# Patient Record
Sex: Female | Born: 1960
Health system: Southern US, Community
[De-identification: ages and names within clinical notes are randomized; demographics above are authoritative.]

## PROBLEM LIST (undated history)

## (undated) DIAGNOSIS — K219 Gastro-esophageal reflux disease without esophagitis: Secondary | ICD-10-CM

## (undated) DIAGNOSIS — R194 Change in bowel habit: Secondary | ICD-10-CM

## (undated) DIAGNOSIS — M545 Low back pain, unspecified: Secondary | ICD-10-CM

## (undated) DIAGNOSIS — F17200 Nicotine dependence, unspecified, uncomplicated: Secondary | ICD-10-CM

## (undated) DIAGNOSIS — E669 Obesity, unspecified: Secondary | ICD-10-CM

## (undated) DIAGNOSIS — I10 Essential (primary) hypertension: Secondary | ICD-10-CM

## (undated) DIAGNOSIS — G8929 Other chronic pain: Secondary | ICD-10-CM

## (undated) DIAGNOSIS — M549 Dorsalgia, unspecified: Secondary | ICD-10-CM

## (undated) DIAGNOSIS — K8020 Calculus of gallbladder without cholecystitis without obstruction: Secondary | ICD-10-CM

## (undated) DIAGNOSIS — M1990 Unspecified osteoarthritis, unspecified site: Secondary | ICD-10-CM

## (undated) DIAGNOSIS — E785 Hyperlipidemia, unspecified: Secondary | ICD-10-CM

## (undated) DIAGNOSIS — R7611 Nonspecific reaction to tuberculin skin test without active tuberculosis: Secondary | ICD-10-CM

## (undated) DIAGNOSIS — B1920 Unspecified viral hepatitis C without hepatic coma: Secondary | ICD-10-CM

## (undated) DIAGNOSIS — R7303 Prediabetes: Secondary | ICD-10-CM

## (undated) DIAGNOSIS — K810 Acute cholecystitis: Secondary | ICD-10-CM

## (undated) HISTORY — DX: Change in bowel habit: R19.4

## (undated) HISTORY — DX: Unspecified viral hepatitis C without hepatic coma: B19.20

## (undated) HISTORY — PX: OTHER SURGICAL HISTORY: SHX169

## (undated) HISTORY — DX: Essential (primary) hypertension: I10

## (undated) HISTORY — PX: COLONOSCOPY: SHX174

## (undated) HISTORY — DX: Nonspecific reaction to tuberculin skin test without active tuberculosis: R76.11

## (undated) HISTORY — DX: Hyperlipidemia, unspecified: E78.5

## (undated) HISTORY — DX: Gastro-esophageal reflux disease without esophagitis: K21.9

---
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
## 1986-10-15 HISTORY — PX: OTHER SURGICAL HISTORY: SHX169

## 1987-02-13 HISTORY — PX: TUBAL LIGATION: SHX77

## 1988-10-15 HISTORY — PX: APPENDECTOMY: SHX54

## 1998-03-09 ENCOUNTER — Encounter: Admission: RE | Admit: 1998-03-09 | Discharge: 1998-03-09 | Payer: Self-pay | Admitting: Family Medicine

## 1998-03-11 ENCOUNTER — Encounter: Admission: RE | Admit: 1998-03-11 | Discharge: 1998-03-11 | Payer: Self-pay | Admitting: Family Medicine

## 1998-09-05 ENCOUNTER — Ambulatory Visit (HOSPITAL_COMMUNITY): Admission: RE | Admit: 1998-09-05 | Discharge: 1998-09-05 | Payer: Self-pay | Admitting: *Deleted

## 1998-12-23 ENCOUNTER — Other Ambulatory Visit: Admission: RE | Admit: 1998-12-23 | Discharge: 1998-12-23 | Payer: Self-pay | Admitting: *Deleted

## 1999-11-16 ENCOUNTER — Encounter: Admission: RE | Admit: 1999-11-16 | Discharge: 1999-11-16 | Payer: Self-pay | Admitting: Internal Medicine

## 1999-11-16 ENCOUNTER — Encounter: Payer: Self-pay | Admitting: Internal Medicine

## 2000-06-18 ENCOUNTER — Other Ambulatory Visit: Admission: RE | Admit: 2000-06-18 | Discharge: 2000-06-18 | Payer: Self-pay | Admitting: Internal Medicine

## 2000-07-10 ENCOUNTER — Encounter: Admission: RE | Admit: 2000-07-10 | Discharge: 2000-07-10 | Payer: Self-pay | Admitting: Internal Medicine

## 2000-07-10 ENCOUNTER — Encounter: Payer: Self-pay | Admitting: Internal Medicine

## 2000-07-22 ENCOUNTER — Encounter: Admission: RE | Admit: 2000-07-22 | Discharge: 2000-10-20 | Payer: Self-pay | Admitting: Internal Medicine

## 2000-12-09 ENCOUNTER — Encounter: Admission: RE | Admit: 2000-12-09 | Discharge: 2001-03-09 | Payer: Self-pay | Admitting: Internal Medicine

## 2001-09-01 ENCOUNTER — Other Ambulatory Visit: Admission: RE | Admit: 2001-09-01 | Discharge: 2001-09-01 | Payer: Self-pay | Admitting: Internal Medicine

## 2001-09-08 ENCOUNTER — Encounter: Payer: Self-pay | Admitting: Internal Medicine

## 2001-09-08 ENCOUNTER — Encounter: Admission: RE | Admit: 2001-09-08 | Discharge: 2001-09-08 | Payer: Self-pay | Admitting: Internal Medicine

## 2001-09-10 ENCOUNTER — Encounter: Admission: RE | Admit: 2001-09-10 | Discharge: 2001-09-10 | Payer: Self-pay | Admitting: Internal Medicine

## 2001-09-10 ENCOUNTER — Encounter: Payer: Self-pay | Admitting: Internal Medicine

## 2001-09-16 ENCOUNTER — Ambulatory Visit (HOSPITAL_COMMUNITY): Admission: RE | Admit: 2001-09-16 | Discharge: 2001-09-16 | Payer: Self-pay | Admitting: Internal Medicine

## 2001-09-16 ENCOUNTER — Encounter: Payer: Self-pay | Admitting: Internal Medicine

## 2001-11-28 ENCOUNTER — Encounter: Admission: RE | Admit: 2001-11-28 | Discharge: 2002-02-26 | Payer: Self-pay | Admitting: Internal Medicine

## 2001-12-16 ENCOUNTER — Ambulatory Visit (HOSPITAL_COMMUNITY): Admission: RE | Admit: 2001-12-16 | Discharge: 2001-12-16 | Payer: Self-pay | Admitting: Gastroenterology

## 2001-12-16 ENCOUNTER — Encounter: Payer: Self-pay | Admitting: Gastroenterology

## 2001-12-16 ENCOUNTER — Encounter (INDEPENDENT_AMBULATORY_CARE_PROVIDER_SITE_OTHER): Payer: Self-pay | Admitting: Specialist

## 2003-03-05 ENCOUNTER — Encounter: Payer: Self-pay | Admitting: Internal Medicine

## 2003-03-05 ENCOUNTER — Encounter: Admission: RE | Admit: 2003-03-05 | Discharge: 2003-03-05 | Payer: Self-pay | Admitting: Internal Medicine

## 2004-08-14 ENCOUNTER — Encounter: Admission: RE | Admit: 2004-08-14 | Discharge: 2004-08-14 | Payer: Self-pay | Admitting: Internal Medicine

## 2004-12-08 ENCOUNTER — Emergency Department (HOSPITAL_COMMUNITY): Admission: EM | Admit: 2004-12-08 | Discharge: 2004-12-08 | Payer: Self-pay | Admitting: Family Medicine

## 2006-07-02 ENCOUNTER — Encounter: Admission: RE | Admit: 2006-07-02 | Discharge: 2006-07-02 | Payer: Self-pay | Admitting: Internal Medicine

## 2008-03-11 DIAGNOSIS — R109 Unspecified abdominal pain: Secondary | ICD-10-CM | POA: Insufficient documentation

## 2008-03-11 DIAGNOSIS — R110 Nausea: Secondary | ICD-10-CM

## 2008-03-11 DIAGNOSIS — R197 Diarrhea, unspecified: Secondary | ICD-10-CM

## 2008-03-11 DIAGNOSIS — E739 Lactose intolerance, unspecified: Secondary | ICD-10-CM

## 2008-03-11 DIAGNOSIS — R634 Abnormal weight loss: Secondary | ICD-10-CM

## 2008-03-11 DIAGNOSIS — I10 Essential (primary) hypertension: Secondary | ICD-10-CM | POA: Insufficient documentation

## 2008-03-11 DIAGNOSIS — B1710 Acute hepatitis C without hepatic coma: Secondary | ICD-10-CM

## 2008-03-12 ENCOUNTER — Ambulatory Visit: Payer: Self-pay | Admitting: Internal Medicine

## 2008-05-03 ENCOUNTER — Ambulatory Visit: Payer: Self-pay | Admitting: Internal Medicine

## 2008-05-03 ENCOUNTER — Encounter: Payer: Self-pay | Admitting: Internal Medicine

## 2008-05-04 ENCOUNTER — Encounter: Payer: Self-pay | Admitting: Internal Medicine

## 2008-09-12 ENCOUNTER — Ambulatory Visit (HOSPITAL_COMMUNITY): Admission: RE | Admit: 2008-09-12 | Discharge: 2008-09-12 | Payer: Self-pay | Admitting: Internal Medicine

## 2009-04-08 ENCOUNTER — Encounter: Admission: RE | Admit: 2009-04-08 | Discharge: 2009-04-08 | Payer: Self-pay | Admitting: Internal Medicine

## 2010-03-30 ENCOUNTER — Other Ambulatory Visit: Admission: RE | Admit: 2010-03-30 | Discharge: 2010-03-30 | Payer: Self-pay | Admitting: Family Medicine

## 2010-11-05 ENCOUNTER — Encounter: Payer: Self-pay | Admitting: Internal Medicine

## 2011-04-19 ENCOUNTER — Inpatient Hospital Stay (INDEPENDENT_AMBULATORY_CARE_PROVIDER_SITE_OTHER)
Admission: RE | Admit: 2011-04-19 | Discharge: 2011-04-19 | Disposition: A | Discharge status: Home / Self Care | Payer: 59 | Source: Ambulatory Visit | Attending: Family Medicine | Admitting: Family Medicine

## 2011-04-19 DIAGNOSIS — J029 Acute pharyngitis, unspecified: Secondary | ICD-10-CM

## 2011-11-13 ENCOUNTER — Other Ambulatory Visit: Payer: Self-pay | Admitting: Family Medicine

## 2011-11-13 DIAGNOSIS — Z1231 Encounter for screening mammogram for malignant neoplasm of breast: Secondary | ICD-10-CM

## 2011-11-15 ENCOUNTER — Ambulatory Visit
Admission: RE | Admit: 2011-11-15 | Discharge: 2011-11-15 | Disposition: A | Discharge status: Home / Self Care | Payer: 59 | Source: Ambulatory Visit | Attending: Family Medicine | Admitting: Family Medicine

## 2011-11-15 DIAGNOSIS — Z1231 Encounter for screening mammogram for malignant neoplasm of breast: Secondary | ICD-10-CM

## 2012-08-20 ENCOUNTER — Other Ambulatory Visit: Payer: Self-pay | Admitting: Internal Medicine

## 2012-08-20 MED ORDER — TRIAMTERENE-HCTZ 37.5-25 MG PO CAPS: 1.0000 | ORAL_CAPSULE | ORAL | Status: DC

## 2012-08-20 NOTE — Progress Notes (Signed)
Has appt with me to est care. Out of triamterene / HCTZ 2 weeks. Reviewed last ER note and that was her med. Will refill.

## 2012-09-16 ENCOUNTER — Encounter: Payer: 59 | Admitting: Internal Medicine

## 2012-09-17 ENCOUNTER — Other Ambulatory Visit (INDEPENDENT_AMBULATORY_CARE_PROVIDER_SITE_OTHER): Payer: 59

## 2012-09-17 ENCOUNTER — Encounter: Payer: Self-pay | Admitting: Internal Medicine

## 2012-09-17 ENCOUNTER — Ambulatory Visit (INDEPENDENT_AMBULATORY_CARE_PROVIDER_SITE_OTHER): Payer: 59 | Admitting: Internal Medicine

## 2012-09-17 VITALS — BP 126/74 | HR 65 | Temp 98.1°F | Ht 65.5 in | Wt 234.0 lb

## 2012-09-17 DIAGNOSIS — F411 Generalized anxiety disorder: Secondary | ICD-10-CM

## 2012-09-17 DIAGNOSIS — Z1329 Encounter for screening for other suspected endocrine disorder: Secondary | ICD-10-CM

## 2012-09-17 DIAGNOSIS — Z131 Encounter for screening for diabetes mellitus: Secondary | ICD-10-CM

## 2012-09-17 DIAGNOSIS — Z13220 Encounter for screening for lipoid disorders: Secondary | ICD-10-CM

## 2012-09-17 DIAGNOSIS — Z0000 Encounter for general adult medical examination without abnormal findings: Secondary | ICD-10-CM

## 2012-09-17 DIAGNOSIS — Z1321 Encounter for screening for nutritional disorder: Secondary | ICD-10-CM

## 2012-09-17 DIAGNOSIS — M8580 Other specified disorders of bone density and structure, unspecified site: Secondary | ICD-10-CM

## 2012-09-17 DIAGNOSIS — Z130 Encounter for screening for diseases of the blood and blood-forming organs and certain disorders involving the immune mechanism: Secondary | ICD-10-CM

## 2012-09-17 DIAGNOSIS — I10 Essential (primary) hypertension: Secondary | ICD-10-CM

## 2012-09-17 DIAGNOSIS — M949 Disorder of cartilage, unspecified: Secondary | ICD-10-CM

## 2012-09-17 DIAGNOSIS — E785 Hyperlipidemia, unspecified: Secondary | ICD-10-CM

## 2012-09-17 DIAGNOSIS — K219 Gastro-esophageal reflux disease without esophagitis: Secondary | ICD-10-CM

## 2012-09-17 DIAGNOSIS — F419 Anxiety disorder, unspecified: Secondary | ICD-10-CM

## 2012-09-17 DIAGNOSIS — B86 Scabies: Secondary | ICD-10-CM

## 2012-09-17 LAB — CBC
HCT: 40.7 % (ref 36.0–46.0)
Hemoglobin: 13.4 g/dL (ref 12.0–15.0)
MCV: 87.6 fl (ref 78.0–100.0)
RBC: 4.64 Mil/uL (ref 3.87–5.11)
RDW: 16 % — ABNORMAL HIGH (ref 11.5–14.6)

## 2012-09-17 LAB — BASIC METABOLIC PANEL
BUN: 11 mg/dL (ref 6–23)
CO2: 32 mEq/L (ref 19–32)
Chloride: 101 mEq/L (ref 96–112)
Potassium: 3.6 mEq/L (ref 3.5–5.1)
Sodium: 139 mEq/L (ref 135–145)

## 2012-09-17 LAB — HEMOGLOBIN A1C: Hgb A1c MFr Bld: 6.1 % (ref 4.6–6.5)

## 2012-09-17 LAB — HEPATIC FUNCTION PANEL
ALT: 25 U/L (ref 0–35)
AST: 29 U/L (ref 0–37)
Albumin: 3.8 g/dL (ref 3.5–5.2)
Bilirubin, Direct: 0.1 mg/dL (ref 0.0–0.3)
Total Protein: 7.3 g/dL (ref 6.0–8.3)

## 2012-09-17 LAB — TSH: TSH: 1.03 u[IU]/mL (ref 0.35–5.50)

## 2012-09-17 LAB — LIPID PANEL: Total CHOL/HDL Ratio: 4

## 2012-09-17 MED ORDER — SIMVASTATIN 40 MG PO TABS: 40.0000 mg | ORAL_TABLET | Freq: Every day | ORAL | Status: DC

## 2012-09-17 MED ORDER — ALPRAZOLAM 0.5 MG PO TABS: 0.5000 mg | ORAL_TABLET | Freq: Every evening | ORAL | Status: DC | PRN

## 2012-09-17 MED ORDER — BUPROPION HCL ER (SR) 150 MG PO TB12: 150.0000 mg | ORAL_TABLET | Freq: Every day | ORAL | Status: DC

## 2012-09-17 MED ORDER — TRIAMTERENE-HCTZ 37.5-25 MG PO CAPS: 1.0000 | ORAL_CAPSULE | ORAL | Status: DC

## 2012-09-17 MED ORDER — PERMETHRIN 1 % EX LOTN: TOPICAL_LOTION | Freq: Once | CUTANEOUS | Status: DC

## 2012-09-17 MED ORDER — PANTOPRAZOLE SODIUM 40 MG PO TBEC: 40.0000 mg | DELAYED_RELEASE_TABLET | Freq: Two times a day (BID) | ORAL | Status: DC

## 2012-09-17 NOTE — Patient Instructions (Signed)

## 2012-09-17 NOTE — Progress Notes (Signed)
HPI  Pt presents to the clinic today to establish care. Her only concern is that she has a rash on her hands and feet and all over her abdomen. It is very itchy. She has been to dermatology who has prescribed her some cream for this rash but it is not helping.  Flu vaccine: 2013 Tdap: within the last 10 years Colon Screening: within the last 10 years Pap 2012 LMP 08/2012 Mammogram 2013 Bone density never  Past Medical History  Diagnosis Date  . GERD (gastroesophageal reflux disease)   . Hepatitis C   . Hypertension   . Hyperlipidemia   . PPD positive     Current Outpatient Prescriptions  Medication Sig Dispense Refill  . buPROPion (WELLBUTRIN SR) 150 MG 12 hr tablet Take 150 mg by mouth daily.      . Calcitriol (VECTICAL) 3 MCG/GM cream Apply topically at bedtime.      . clobetasol cream (TEMOVATE) 0.05 % Apply topically 2 (two) times daily.      . cyclobenzaprine (FLEXERIL) 10 MG tablet Take 10 mg by mouth as needed.      . gabapentin (NEURONTIN) 100 MG capsule Take 100 mg by mouth 2 (two) times daily.      . meloxicam (MOBIC) 15 MG tablet Take 15 mg by mouth daily.      . pantoprazole (PROTONIX) 40 MG tablet Take 40 mg by mouth 2 (two) times daily.      . simvastatin (ZOCOR) 40 MG tablet Take 40 mg by mouth daily.      . traMADol (ULTRAM) 50 MG tablet Take 50 mg by mouth every 6 (six) hours as needed.      . triamterene-hydrochlorothiazide (DYAZIDE) 37.5-25 MG per capsule Take 1 each (1 capsule total) by mouth every morning.  30 capsule  1    No Known Allergies  Family History  Problem Relation Age of Onset  . Cancer Mother     Colon Cancer  . Hyperlipidemia Mother   . Hypertension Mother   . Hypertension Father   . Diabetes Father   . Diabetes Sister   . Diabetes Maternal Grandfather     History   Social History  . Marital Status: Divorced    Spouse Name: N/A    Number of Children: 3  . Years of Education: 12+   Occupational History  . Communication  Specialist [employer]   Social History Main Topics  . Smoking status: Current Every Day Smoker -- 0.5 packs/day  . Smokeless tobacco: Not on file  . Alcohol Use: 1.7 oz/week    1 Glasses of wine, 1 Cans of beer, 1 Drinks containing 0.5 oz of alcohol per week  . Drug Use: No  . Sexually Active: Not on file   Other Topics Concern  . Not on file   Social History Narrative   Regular exercise-noCaffeine Use-yes    ROS:  Constitutional: Denies fever, malaise, fatigue, headache or abrupt weight changes.  HEENT: Denies eye pain, eye redness, ear pain, ringing in the ears, wax buildup, runny nose, nasal congestion, bloody nose, or sore throat. Respiratory: Denies difficulty breathing, shortness of breath, cough or sputum production.   Cardiovascular: Denies chest pain, chest tightness, palpitations or swelling in the hands or feet.  Gastrointestinal: Denies abdominal pain, bloating, constipation, diarrhea or blood in the stool.  GU: Denies frequency, urgency, pain with urination, blood in urine, odor or discharge. Musculoskeletal: Denies decrease in range of motion, difficulty with gait, muscle pain or joint pain  and swelling.  Skin: Pt reports rash on hands, feet and abdomen. Denies redness, lesions or ulcercations.  Neurological: Denies dizziness, difficulty with memory, difficulty with speech or problems with balance and coordination.   No other specific complaints in a complete review of systems (except as listed in HPI above).  PE:  BP 126/74  Pulse 65  Temp 98.1 F (36.7 C) (Oral)  Ht 5' 5.5" (1.664 m)  Wt 234 lb (106.142 kg)  BMI 38.35 kg/m2  SpO2 95%  LMP 08/15/2012 Wt Readings from Last 3 Encounters:  09/17/12 234 lb (106.142 kg)  03/12/08 255 lb 8 oz (115.894 kg)    General: Appears her stated age, obese but well developed, well nourished in NAD. Skin: Papular rash noted on bilateral hands in between fingers with burrow tracks. Scabbed over from the patient  scratching. Similar rash noted on lower legs and abdomen. HEENT: Head: normal shape and size; Eyes: sclera white, no icterus, conjunctiva pink, PERRLA and EOMs intact; Ears: Tm's gray and intact, normal light reflex; Nose: mucosa pink and moist, septum midline; Throat/Mouth: Teeth present, mucosa pink and moist, no lesions or ulcerations noted.  Neck: Normal range of motion. Neck supple, trachea midline. No massses, lumps or thyromegaly present.  Cardiovascular: Normal rate and rhythm. S1,S2 noted.  No murmur, rubs or gallops noted. No JVD or BLE edema. No carotid bruits noted. Pulmonary/Chest: Normal effort and positive vesicular breath sounds. No respiratory distress. No wheezes, rales or ronchi noted.  Abdomen: Soft and nontender. Normal bowel sounds, no bruits noted. No distention or masses noted. Liver, spleen and kidneys non palpable. Musculoskeletal: Normal range of motion. No signs of joint swelling. No difficulty with gait.  Neurological: Alert and oriented. Cranial nerves II-XII intact. Coordination normal. +DTRs bilaterally. Psychiatric: Mood and affect normal. Behavior is normal. Judgment and thought content normal.    Assessment and Plan:  Preventative Health Maintenance:  Will obtain basic labs: CBC, BMET, Vit D, lipids, LFT, TSH and Hgb A1C Not due for colonoscopy, mammogram or pap Will obtain bone density screening today Meds refilled  Rash, likely die to scabies, new onset with additional workup required:  Permetherin cream apply topically once Wash all belongings in hot water  RTC in 1 year or before if needed.

## 2012-09-18 ENCOUNTER — Other Ambulatory Visit: Payer: Self-pay | Admitting: Internal Medicine

## 2012-09-18 DIAGNOSIS — E559 Vitamin D deficiency, unspecified: Secondary | ICD-10-CM

## 2012-09-18 LAB — VITAMIN D 25 HYDROXY (VIT D DEFICIENCY, FRACTURES): Vit D, 25-Hydroxy: 19 ng/mL — ABNORMAL LOW (ref 30–89)

## 2012-09-18 MED ORDER — ERGOCALCIFEROL 1.25 MG (50000 UT) PO CAPS: 50000.0000 [IU] | ORAL_CAPSULE | ORAL | Status: DC

## 2012-09-25 ENCOUNTER — Other Ambulatory Visit: Payer: Self-pay | Admitting: Internal Medicine

## 2012-09-25 DIAGNOSIS — B86 Scabies: Secondary | ICD-10-CM

## 2012-09-25 MED ORDER — PERMETHRIN 1 % EX LOTN: TOPICAL_LOTION | Freq: Once | CUTANEOUS | Status: DC

## 2012-09-26 ENCOUNTER — Other Ambulatory Visit (INDEPENDENT_AMBULATORY_CARE_PROVIDER_SITE_OTHER): Payer: Self-pay

## 2012-09-30 ENCOUNTER — Encounter: Payer: 59 | Admitting: Internal Medicine

## 2012-09-30 ENCOUNTER — Ambulatory Visit: Payer: 59 | Admitting: Internal Medicine

## 2012-10-10 ENCOUNTER — Other Ambulatory Visit: Payer: Self-pay | Admitting: Internal Medicine

## 2012-10-29 ENCOUNTER — Other Ambulatory Visit: Payer: Self-pay | Admitting: Internal Medicine

## 2012-11-03 ENCOUNTER — Other Ambulatory Visit: Payer: Self-pay | Admitting: *Deleted

## 2012-11-03 DIAGNOSIS — F419 Anxiety disorder, unspecified: Secondary | ICD-10-CM

## 2012-11-03 MED ORDER — ALPRAZOLAM 0.5 MG PO TABS: 0.5000 mg | ORAL_TABLET | Freq: Every evening | ORAL | Status: DC | PRN

## 2012-11-03 NOTE — Telephone Encounter (Signed)
Rx faxed to [facility].  

## 2012-11-03 NOTE — Telephone Encounter (Signed)
Ok to refill 

## 2012-11-03 NOTE — Telephone Encounter (Signed)
R'cd fax from [facility] for refill of Alprazolam-last written 09/17/2012 #30 with 0 refills. Please advise.

## 2012-12-22 ENCOUNTER — Other Ambulatory Visit: Payer: Self-pay | Admitting: Internal Medicine

## 2012-12-29 ENCOUNTER — Other Ambulatory Visit: Payer: Self-pay | Admitting: Internal Medicine

## 2013-02-09 ENCOUNTER — Other Ambulatory Visit: Payer: Self-pay

## 2013-02-09 DIAGNOSIS — Z1231 Encounter for screening mammogram for malignant neoplasm of breast: Secondary | ICD-10-CM

## 2013-02-12 ENCOUNTER — Telehealth: Payer: Self-pay | Admitting: Internal Medicine

## 2013-02-12 ENCOUNTER — Other Ambulatory Visit: Payer: Self-pay | Admitting: Internal Medicine

## 2013-02-12 DIAGNOSIS — Z1211 Encounter for screening for malignant neoplasm of colon: Secondary | ICD-10-CM

## 2013-02-12 NOTE — Telephone Encounter (Signed)
Referral placed, they will call her when the appointment is set up

## 2013-02-12 NOTE — Telephone Encounter (Signed)
Per [employer] message patient is requesting a referral be placed for her to have a colonoscopy with [facility]

## 2013-02-16 ENCOUNTER — Encounter: Payer: Self-pay | Admitting: Internal Medicine

## 2013-02-25 ENCOUNTER — Ambulatory Visit: Payer: 59

## 2013-04-14 ENCOUNTER — Ambulatory Visit (AMBULATORY_SURGERY_CENTER): Payer: 59

## 2013-04-14 VITALS — Ht 65.5 in | Wt 243.2 lb

## 2013-04-14 DIAGNOSIS — Z800 Family history of malignant neoplasm of digestive organs: Secondary | ICD-10-CM

## 2013-04-14 DIAGNOSIS — Z86010 Personal history of colonic polyps: Secondary | ICD-10-CM

## 2013-04-14 MED ORDER — MOVIPREP 100 G PO SOLR: ORAL | Status: DC

## 2013-04-24 ENCOUNTER — Encounter: Payer: 59 | Admitting: Internal Medicine

## 2013-04-24 ENCOUNTER — Encounter: Payer: Self-pay | Admitting: Internal Medicine

## 2013-04-24 ENCOUNTER — Ambulatory Visit (AMBULATORY_SURGERY_CENTER): Payer: 59 | Admitting: Internal Medicine

## 2013-04-24 VITALS — BP 119/57 | HR 60 | Temp 98.1°F | Resp 15 | Ht 65.5 in | Wt 243.0 lb

## 2013-04-24 DIAGNOSIS — D126 Benign neoplasm of colon, unspecified: Secondary | ICD-10-CM

## 2013-04-24 DIAGNOSIS — Z86010 Personal history of colonic polyps: Secondary | ICD-10-CM

## 2013-04-24 MED ORDER — SODIUM CHLORIDE 0.9 % IV SOLN: 500.0000 mL | INTRAVENOUS | Status: DC

## 2013-04-24 NOTE — Op Note (Signed)
[facility] [address] [place], [address]   COLONOSCOPY PROCEDURE REPORT  PATIENT: Jody [name], Jody [name]  MR#: [id number] BIRTHDATE: 1960/10/28 , 51  yrs. old GENDER: Female ENDOSCOPIST: Hart Carwin, MD REFERRED BY:  Nicki Reaper.NP PROCEDURE DATE:  04/24/2013 PROCEDURE:   Colonoscopy with cold biopsy polypectomy ASA CLASS:   Class II INDICATIONS:Patient's immediate family history of colon cancer, Patient's personal history of colon polyps, and prior colonoscopy in 2004 and 2009, parent with colon cancer at 12. MEDICATIONS: MAC sedation, administered by CRNA and propofol (Diprivan) 300mg  IV  DESCRIPTION OF PROCEDURE:   After the risks and benefits and of the procedure were explained, informed consent was obtained.  A digital rectal exam revealed no abnormalities of the rectum.    The LB [id number]  endoscope was introduced through the anus and advanced to the cecum, which was identified by both the appendix and ileocecal valve .  The quality of the prep was excellent, using MoviPrep .  The instrument was then slowly withdrawn as the colon was fully examined.     COLON FINDINGS: Two smooth sessile polyps ranging between 3-76mm in size were found in the descending colon.  A polypectomy was performed with cold forceps.  The resection was complete and the polyp tissue was completely retrieved.     Retroflexed views revealed no abnormalities.     The scope was then withdrawn from the patient and the procedure completed.  COMPLICATIONS: There were no complications. ENDOSCOPIC IMPRESSION: Two sessile polyps ranging between 3-27mm in size were found in the descending colon; polypectomy was performed with cold forceps  RECOMMENDATIONS: 1.  Await pathology results 2.  High fiber diet   REPEAT EXAM: In 5 year(s)  for Colonoscopy.  cc:  _______________________________ eSignedHart Carwin, MD 04/24/2013 10:40 AM     PATIENT NAME:  Jody [name], Jody [name] MR#: [id number]

## 2013-04-24 NOTE — Progress Notes (Signed)
Patient did not experience any of the following events: a burn prior to discharge; a fall within the facility; wrong site/side/patient/procedure/implant event; or a hospital transfer or hospital admission upon discharge from the facility. (G8907) Patient did not have preoperative order for IV antibiotic SSI prophylaxis. (G8918)  

## 2013-04-24 NOTE — Patient Instructions (Addendum)

## 2013-04-24 NOTE — Progress Notes (Signed)
Called to room to assist during endoscopic procedure.  Patient ID and intended procedure confirmed with present staff. Received instructions for my participation in the procedure from the performing physician.  

## 2013-04-24 NOTE — Progress Notes (Signed)
Stable to RR 

## 2013-04-27 ENCOUNTER — Telehealth: Payer: Self-pay | Admitting: *Deleted

## 2013-04-27 NOTE — Telephone Encounter (Signed)
  Follow up Call-  Call back number 04/24/2013  Post procedure Call Back phone  # [phone]  Permission to leave phone message Yes     No answer at # given.  Message left on voicemail.

## 2013-04-29 ENCOUNTER — Encounter: Payer: Self-pay | Admitting: Internal Medicine

## 2013-05-07 ENCOUNTER — Ambulatory Visit (INDEPENDENT_AMBULATORY_CARE_PROVIDER_SITE_OTHER): Payer: 59 | Admitting: Internal Medicine

## 2013-05-07 ENCOUNTER — Encounter: Payer: Self-pay | Admitting: Internal Medicine

## 2013-05-07 ENCOUNTER — Other Ambulatory Visit (INDEPENDENT_AMBULATORY_CARE_PROVIDER_SITE_OTHER): Payer: 59

## 2013-05-07 VITALS — BP 142/88 | HR 81 | Temp 98.5°F | Wt 236.0 lb

## 2013-05-07 DIAGNOSIS — R1013 Epigastric pain: Secondary | ICD-10-CM

## 2013-05-07 DIAGNOSIS — R112 Nausea with vomiting, unspecified: Secondary | ICD-10-CM

## 2013-05-07 LAB — LIPASE: Lipase: 14 U/L (ref 11.0–59.0)

## 2013-05-07 LAB — CBC
HCT: 38.2 % (ref 36.0–46.0)
Hemoglobin: 12.8 g/dL (ref 12.0–15.0)
MCV: 89.2 fl (ref 78.0–100.0)
RBC: 4.28 Mil/uL (ref 3.87–5.11)
WBC: 9.2 10*3/uL (ref 4.5–10.5)

## 2013-05-07 LAB — COMPREHENSIVE METABOLIC PANEL
Alkaline Phosphatase: 50 U/L (ref 39–117)
CO2: 30 mEq/L (ref 19–32)
Creatinine, Ser: 0.8 mg/dL (ref 0.4–1.2)
GFR: 95.93 mL/min (ref >60.00)
Glucose, Bld: 122 mg/dL — ABNORMAL HIGH (ref 70–99)
Total Bilirubin: 1.5 mg/dL — ABNORMAL HIGH (ref 0.3–1.2)

## 2013-05-07 LAB — AMYLASE: Amylase: 55 U/L (ref 27–131)

## 2013-05-07 MED ORDER — ONDANSETRON HCL 4 MG PO TABS: 4.0000 mg | ORAL_TABLET | Freq: Three times a day (TID) | ORAL | Status: DC | PRN

## 2013-05-07 NOTE — Patient Instructions (Signed)

## 2013-05-07 NOTE — Progress Notes (Signed)
Subjective:    Patient ID: Jody [name], female    DOB: 1961/10/09, 52 y.o.   MRN: [id number]  HPI  Pt presents to the clinic today with c/o nausea, vomiting, chills and heartburn since Sunday. It does seem to be worse after she eats. She does not have a fever that she is aware of. She denies blood in her stool. She has not been able to eat anything really in 4 days. She is having normal BM's. She did eat tuna on Sunday but she has had this multiple times before. She has increased her protonix but this does not help. She does feel like she is getting any better. Sometimes laying down makes her feel better. She has never experienced anything like this before.   Review of Systems      Past Medical History  Diagnosis Date  . GERD (gastroesophageal reflux disease)   . Hepatitis C   . Hypertension   . Hyperlipidemia   . PPD positive   . Bowel habit changes     Current Outpatient Prescriptions  Medication Sig Dispense Refill  . ALPRAZolam (XANAX) 0.5 MG tablet TAKE 1 TABLET BY MOUTH AT BEDTIME AS NEEDED FOR SLEEP  30 tablet  0  . buPROPion (WELLBUTRIN SR) 150 MG 12 hr tablet Take 1 tablet (150 mg total) by mouth daily.  90 tablet  3  . Calcitriol (VECTICAL) 3 MCG/GM cream Apply topically as needed.       . clobetasol cream (TEMOVATE) 0.05 % Apply topically as needed.       . cyclobenzaprine (FLEXERIL) 10 MG tablet Take 10 mg by mouth as needed.      . meloxicam (MOBIC) 15 MG tablet Take 15 mg by mouth daily.      . pantoprazole (PROTONIX) 40 MG tablet Take 1 tablet (40 mg total) by mouth 2 (two) times daily.  180 tablet  3  . permethrin (ELIMITE) 1 % lotion SHAMPOO, RINSE AND TOWEL DRY, SATURATE HAIR AND SCALP WITH PERMETHRIN, RINSE AFTER 10 MINUTES, REPEAT IN 1 WEEK IF NEEDED  120 mL  PRN  . simvastatin (ZOCOR) 40 MG tablet Take 1 tablet (40 mg total) by mouth daily.  90 tablet  3  . traMADol (ULTRAM) 50 MG tablet Take 50 mg by mouth every 6 (six) hours as needed.      .  triamterene-hydrochlorothiazide (DYAZIDE) 37.5-25 MG per capsule Take 1 each (1 capsule total) by mouth every morning.  90 capsule  3  . ergocalciferol (VITAMIN D2) 50000 UNITS capsule Take 1 capsule (50,000 Units total) by mouth once a week.  4 capsule  12  . gabapentin (NEURONTIN) 100 MG capsule Take 100 mg by mouth 2 (two) times daily.       No current facility-administered medications for this visit.    No Known Allergies  Family History  Problem Relation Age of Onset  . Cancer Mother     Colon Cancer  . Hyperlipidemia Mother   . Hypertension Mother   . Colon cancer Mother   . Hypertension Father   . Diabetes Father   . Diabetes Sister   . Diabetes Maternal Grandfather     History   Social History  . Marital Status: Divorced    Spouse Name: N/A    Number of Children: 3  . Years of Education: 12+   Occupational History  . Communication Specialist [employer]   Social History Main Topics  . Smoking status: Current Every Day Smoker  Types: Cigarettes  . Smokeless tobacco: Never Used     Comment: Smokes 4 cigarettes a day.  . Alcohol Use: 2.2 oz/week    1 Glasses of wine, 1 Cans of beer, 2 Drinks containing 0.5 oz of alcohol per week  . Drug Use: No  . Sexually Active: Not on file   Other Topics Concern  . Not on file   Social History Narrative   Regular exercise-no   Caffeine Use-yes     Constitutional: Pt reports malaise. Denies fever, fatigue, headache or abrupt weight changes.  Respiratory: Denies difficulty breathing, shortness of breath, cough or sputum production.   Cardiovascular: Denies chest pain, chest tightness, palpitations or swelling in the hands or feet.  Gastrointestinal: Pt reports nausea, vomiting and reflux. Denies abdominal pain, bloating, constipation, diarrhea or blood in the stool.  GU: Denies urgency, frequency, pain with urination, burning sensation, blood in urine, odor or discharge.  No other specific complaints in a complete  review of systems (except as listed in HPI above).   Objective:   Physical Exam  BP 142/88  Pulse 81  Temp(Src) 98.5 F (36.9 C) (Oral)  Wt 236 lb (107.049 kg)  BMI 38.66 kg/m2  SpO2 98%  LMP 03/29/2013 Wt Readings from Last 3 Encounters:  05/07/13 236 lb (107.049 kg)  04/24/13 243 lb (110.224 kg)  04/14/13 243 lb 3.2 oz (110.315 kg)    General: Appears her stated age, obese but well developed, well nourished in NAD.  Cardiovascular: Normal rate and rhythm. S1,S2 noted.  No murmur, rubs or gallops noted. No JVD or BLE edema. No carotid bruits noted. Pulmonary/Chest: Normal effort and positive vesicular breath sounds. No respiratory distress. No wheezes, rales or ronchi noted.  Abdomen: Soft and tender in the epigastric area. Normal bowel sounds, no bruits noted. No distention or masses noted. Liver, spleen and kidneys non palpable.   BMET    Component Value Date/Time   NA 139 09/17/2012 1406   K 3.6 09/17/2012 1406   CL 101 09/17/2012 1406   CO2 32 09/17/2012 1406   GLUCOSE 109* 09/17/2012 1406   BUN 11 09/17/2012 1406   CREATININE 0.7 09/17/2012 1406   CALCIUM 9.0 09/17/2012 1406    Lipid Panel     Component Value Date/Time   CHOL 229* 09/17/2012 1406   TRIG 129.0 09/17/2012 1406   HDL 53.40 09/17/2012 1406   CHOLHDL 4 09/17/2012 1406   VLDL 25.8 09/17/2012 1406    CBC    Component Value Date/Time   WBC 4.2* 09/17/2012 1406   RBC 4.64 09/17/2012 1406   HGB 13.4 09/17/2012 1406   HCT 40.7 09/17/2012 1406   PLT 182.0 09/17/2012 1406   MCV 87.6 09/17/2012 1406   MCHC 33.0 09/17/2012 1406   RDW 16.0* 09/17/2012 1406    Hgb A1C Lab Results  Component Value Date   HGBA1C 6.1 09/17/2012         Assessment & Plan:   Epigastric pain, new onset:  Will obtain CBC, CMET, amylase, lipase, and h pylori Will obtain ultrasound of abdomen to r/o gallstones versus pancreatitis eRx for Zofran  Will f/u after test results

## 2013-05-08 ENCOUNTER — Encounter (HOSPITAL_COMMUNITY): Payer: Self-pay | Admitting: Emergency Medicine

## 2013-05-08 ENCOUNTER — Ambulatory Visit (HOSPITAL_COMMUNITY)
Admission: RE | Admit: 2013-05-08 | Discharge: 2013-05-08 | Disposition: A | Discharge status: Home / Self Care | Payer: 59 | Source: Ambulatory Visit | Attending: Internal Medicine | Admitting: Internal Medicine

## 2013-05-08 ENCOUNTER — Telehealth: Payer: Self-pay | Admitting: *Deleted

## 2013-05-08 ENCOUNTER — Inpatient Hospital Stay (HOSPITAL_COMMUNITY)
Admission: RE | Admit: 2013-05-08 | Discharge: 2013-05-11 | DRG: 419 | Disposition: A | Discharge status: Home / Self Care | Payer: 59 | Source: Ambulatory Visit | Attending: Internal Medicine | Admitting: Internal Medicine

## 2013-05-08 DIAGNOSIS — R7611 Nonspecific reaction to tuberculin skin test without active tuberculosis: Secondary | ICD-10-CM | POA: Diagnosis present

## 2013-05-08 DIAGNOSIS — E669 Obesity, unspecified: Secondary | ICD-10-CM | POA: Diagnosis present

## 2013-05-08 DIAGNOSIS — G8929 Other chronic pain: Secondary | ICD-10-CM | POA: Diagnosis present

## 2013-05-08 DIAGNOSIS — E785 Hyperlipidemia, unspecified: Secondary | ICD-10-CM | POA: Diagnosis present

## 2013-05-08 DIAGNOSIS — F17200 Nicotine dependence, unspecified, uncomplicated: Secondary | ICD-10-CM | POA: Diagnosis present

## 2013-05-08 DIAGNOSIS — K8020 Calculus of gallbladder without cholecystitis without obstruction: Secondary | ICD-10-CM | POA: Diagnosis present

## 2013-05-08 DIAGNOSIS — R1013 Epigastric pain: Secondary | ICD-10-CM

## 2013-05-08 DIAGNOSIS — K219 Gastro-esophageal reflux disease without esophagitis: Secondary | ICD-10-CM | POA: Diagnosis present

## 2013-05-08 DIAGNOSIS — K810 Acute cholecystitis: Secondary | ICD-10-CM | POA: Diagnosis present

## 2013-05-08 DIAGNOSIS — I10 Essential (primary) hypertension: Secondary | ICD-10-CM | POA: Diagnosis present

## 2013-05-08 DIAGNOSIS — K8010 Calculus of gallbladder with chronic cholecystitis without obstruction: Principal | ICD-10-CM | POA: Diagnosis present

## 2013-05-08 DIAGNOSIS — R112 Nausea with vomiting, unspecified: Secondary | ICD-10-CM | POA: Diagnosis present

## 2013-05-08 DIAGNOSIS — B1920 Unspecified viral hepatitis C without hepatic coma: Secondary | ICD-10-CM | POA: Diagnosis present

## 2013-05-08 DIAGNOSIS — M549 Dorsalgia, unspecified: Secondary | ICD-10-CM | POA: Diagnosis present

## 2013-05-08 DIAGNOSIS — N390 Urinary tract infection, site not specified: Secondary | ICD-10-CM

## 2013-05-08 DIAGNOSIS — Z6838 Body mass index (BMI) 38.0-38.9, adult: Secondary | ICD-10-CM

## 2013-05-08 DIAGNOSIS — R109 Unspecified abdominal pain: Secondary | ICD-10-CM

## 2013-05-08 HISTORY — DX: Acute cholecystitis: K81.0

## 2013-05-08 HISTORY — DX: Obesity, unspecified: E66.9

## 2013-05-08 HISTORY — DX: Hyperlipidemia, unspecified: E78.5

## 2013-05-08 HISTORY — DX: Calculus of gallbladder without cholecystitis without obstruction: K80.20

## 2013-05-08 HISTORY — DX: Dorsalgia, unspecified: M54.9

## 2013-05-08 HISTORY — DX: Other chronic pain: G89.29

## 2013-05-08 HISTORY — DX: Nicotine dependence, unspecified, uncomplicated: F17.200

## 2013-05-08 LAB — CBC WITH DIFFERENTIAL/PLATELET
Eosinophils Absolute: 0 10*3/uL (ref 0.0–0.7)
Eosinophils Relative: 0 % (ref 0–5)
HCT: 40.2 % (ref 36.0–46.0)
Lymphocytes Relative: 7 % — ABNORMAL LOW (ref 12–46)
Lymphs Abs: 0.9 10*3/uL (ref 0.7–4.0)
MCH: 29.6 pg (ref 26.0–34.0)
MCV: 86.8 fL (ref 78.0–100.0)
Monocytes Absolute: 1.8 10*3/uL — ABNORMAL HIGH (ref 0.1–1.0)
Platelets: 211 10*3/uL (ref 150–400)
RBC: 4.63 MIL/uL (ref 3.87–5.11)

## 2013-05-08 LAB — URINALYSIS, ROUTINE W REFLEX MICROSCOPIC
Glucose, UA: NEGATIVE mg/dL
Hgb urine dipstick: NEGATIVE
Protein, ur: 100 mg/dL — AB
Specific Gravity, Urine: 1.032 — ABNORMAL HIGH (ref 1.005–1.030)
Urobilinogen, UA: 4 mg/dL — ABNORMAL HIGH (ref 0.0–1.0)

## 2013-05-08 LAB — HEPATIC FUNCTION PANEL
Albumin: 3.8 g/dL (ref 3.5–5.2)
Alkaline Phosphatase: 68 U/L (ref 39–117)
Bilirubin, Direct: 0.4 mg/dL — ABNORMAL HIGH (ref 0.0–0.3)
Indirect Bilirubin: 0.8 mg/dL (ref 0.3–0.9)
Total Bilirubin: 1.2 mg/dL (ref 0.3–1.2)

## 2013-05-08 LAB — BASIC METABOLIC PANEL
BUN: 8 mg/dL (ref 6–23)
CO2: 26 mEq/L (ref 19–32)
Calcium: 9.9 mg/dL (ref 8.4–10.5)
Creatinine, Ser: 0.64 mg/dL (ref 0.50–1.10)
GFR calc non Af Amer: >90 mL/min (ref >90)
Glucose, Bld: 150 mg/dL — ABNORMAL HIGH (ref 70–99)
Sodium: 132 mEq/L — ABNORMAL LOW (ref 135–145)

## 2013-05-08 LAB — URINE MICROSCOPIC-ADD ON

## 2013-05-08 MED ORDER — HYDROMORPHONE HCL PF 1 MG/ML IJ SOLN
0.5000 mg | INTRAMUSCULAR | Status: DC | PRN
  Administered 2013-05-08 – 2013-05-10 (×9): 1 mg via INTRAVENOUS
  Administered 2013-05-10: 0.5 mg via INTRAVENOUS
  Filled 2013-05-08 (×9): qty 1

## 2013-05-08 MED ORDER — PANTOPRAZOLE SODIUM 40 MG IV SOLR
40.0000 mg | Freq: Every day | INTRAVENOUS | Status: DC
  Administered 2013-05-08 – 2013-05-10 (×3): 40 mg via INTRAVENOUS
  Filled 2013-05-08 (×4): qty 40

## 2013-05-08 MED ORDER — DEXTROSE 5 % IV SOLN
1.0000 g | Freq: Once | INTRAVENOUS | Status: AC
  Administered 2013-05-08: 1 g via INTRAVENOUS
  Filled 2013-05-08: qty 10

## 2013-05-08 MED ORDER — DOCUSATE SODIUM 100 MG PO CAPS
100.0000 mg | ORAL_CAPSULE | Freq: Two times a day (BID) | ORAL | Status: DC
  Administered 2013-05-08: 100 mg via ORAL
  Filled 2013-05-08 (×3): qty 1

## 2013-05-08 MED ORDER — AMPICILLIN-SULBACTAM SODIUM 3 (2-1) G IJ SOLR
3.0000 g | Freq: Four times a day (QID) | INTRAMUSCULAR | Status: DC
  Administered 2013-05-08 – 2013-05-11 (×11): 3 g via INTRAVENOUS
  Filled 2013-05-08 (×12): qty 3

## 2013-05-08 MED ORDER — DIPHENHYDRAMINE HCL 12.5 MG/5ML PO ELIX: 12.5000 mg | ORAL_SOLUTION | Freq: Four times a day (QID) | ORAL | Status: DC | PRN

## 2013-05-08 MED ORDER — SODIUM CHLORIDE 0.9 % IV BOLUS (SEPSIS)
1000.0000 mL | Freq: Once | INTRAVENOUS | Status: AC
  Administered 2013-05-08: 1000 mL via INTRAVENOUS

## 2013-05-08 MED ORDER — ONDANSETRON HCL 4 MG/2ML IJ SOLN
4.0000 mg | Freq: Four times a day (QID) | INTRAMUSCULAR | Status: DC | PRN
  Administered 2013-05-08 – 2013-05-11 (×4): 4 mg via INTRAVENOUS
  Filled 2013-05-08 (×4): qty 2

## 2013-05-08 MED ORDER — DIPHENHYDRAMINE HCL 50 MG/ML IJ SOLN: 12.5000 mg | Freq: Four times a day (QID) | INTRAMUSCULAR | Status: DC | PRN

## 2013-05-08 MED ORDER — POTASSIUM CHLORIDE IN NACL 20-0.9 MEQ/L-% IV SOLN
INTRAVENOUS | Status: DC
  Administered 2013-05-08 – 2013-05-09 (×2): via INTRAVENOUS
  Filled 2013-05-08 (×4): qty 1000

## 2013-05-08 MED ORDER — HYDROMORPHONE HCL PF 1 MG/ML IJ SOLN
1.0000 mg | Freq: Once | INTRAMUSCULAR | Status: AC
  Administered 2013-05-08: 1 mg via INTRAVENOUS
  Filled 2013-05-08: qty 1

## 2013-05-08 MED ORDER — ONDANSETRON HCL 4 MG/2ML IJ SOLN
4.0000 mg | Freq: Once | INTRAMUSCULAR | Status: AC
  Administered 2013-05-08: 4 mg via INTRAVENOUS
  Filled 2013-05-08: qty 2

## 2013-05-08 NOTE — ED Provider Notes (Signed)
CSN: [id number]     Arrival date & time 05/08/13  1142 History     First MD Initiated Contact with Patient 05/08/13 1147     Chief Complaint  Patient presents with  . Abdominal Pain   (Consider location/radiation/quality/duration/timing/severity/associated sxs/prior Treatment) HPI Comments: 52 year old female with past medical history of GERD, hepatitis C, hypertension and hyperlipidemia presents to the emergency department from her primary care physician's office with concerns of her gallbladder over the past week. Patient states she has had right upper quadrant and midepigastric abdominal pain over the past 5 days with associated nausea and vomiting. She has been unable to keep anything down. Saw her primary care physician yesterday who checked labs, have her return earlier today for an abdominal ultrasound. Abdominal ultrasound showed gallstone with possible early acute cholecystitis. She was given Zofran by her primary care physician. Last time she vomited was earlier today when she tried to drink some water. Current pain 5/10, worse with movements. Denies fever or chills.  Patient is a 52 y.o. female presenting with abdominal pain. The history is provided by the patient.  Abdominal Pain Associated symptoms include abdominal pain, nausea and vomiting. Pertinent negatives include no chest pain, chills or fever.    Past Medical History  Diagnosis Date  . GERD (gastroesophageal reflux disease)   . Hepatitis C   . Hypertension   . Hyperlipidemia   . PPD positive   . Bowel habit changes    Past Surgical History  Procedure Laterality Date  . Tonsillectomy  1978  . Tubal ligation  02/1987  . Appendectomy  1990  . Colonoscopy  2009  . Umbilical surg      removed naval due to keloids/1995   Family History  Problem Relation Age of Onset  . Cancer Mother     Colon Cancer  . Hyperlipidemia Mother   . Hypertension Mother   . Colon cancer Mother   . Hypertension Father   . Diabetes  Father   . Diabetes Sister   . Diabetes Maternal Grandfather    History  Substance Use Topics  . Smoking status: Current Every Day Smoker    Types: Cigarettes  . Smokeless tobacco: Never Used     Comment: Smokes 4 cigarettes a day.  . Alcohol Use: 2.2 oz/week    1 Glasses of wine, 1 Cans of beer, 2 Drinks containing 0.5 oz of alcohol per week   OB History   Grav Para Term Preterm Abortions TAB SAB Ect Mult Living                 Review of Systems  Constitutional: Negative for fever and chills.  Respiratory: Negative for shortness of breath.   Cardiovascular: Negative for chest pain.  Gastrointestinal: Positive for nausea, vomiting and abdominal pain. Negative for diarrhea and constipation.  Musculoskeletal: Negative for back pain.  All other systems reviewed and are negative.    Allergies  Review of patient's allergies indicates no known allergies.  Home Medications   Current Outpatient Rx  Name  Route  Sig  Dispense  Refill  . ALPRAZolam (XANAX) 0.5 MG tablet      TAKE 1 TABLET BY MOUTH AT BEDTIME AS NEEDED FOR SLEEP   30 tablet   0   . buPROPion (WELLBUTRIN SR) 150 MG 12 hr tablet   Oral   Take 1 tablet (150 mg total) by mouth daily.   90 tablet   3   . Calcitriol (VECTICAL) 3 MCG/GM cream  Topical   Apply topically as needed.          . clobetasol cream (TEMOVATE) 0.05 %   Topical   Apply topically as needed.          . cyclobenzaprine (FLEXERIL) 10 MG tablet   Oral   Take 10 mg by mouth as needed.         . ergocalciferol (VITAMIN D2) 50000 UNITS capsule   Oral   Take 1 capsule (50,000 Units total) by mouth once a week.   4 capsule   12   . gabapentin (NEURONTIN) 100 MG capsule   Oral   Take 100 mg by mouth 2 (two) times daily.         . meloxicam (MOBIC) 15 MG tablet   Oral   Take 15 mg by mouth daily.         . ondansetron (ZOFRAN) 4 MG tablet   Oral   Take 1 tablet (4 mg total) by mouth every 8 (eight) hours as needed for  nausea.   20 tablet   0   . pantoprazole (PROTONIX) 40 MG tablet   Oral   Take 1 tablet (40 mg total) by mouth 2 (two) times daily.   180 tablet   3   . permethrin (ELIMITE) 1 % lotion      SHAMPOO, RINSE AND TOWEL DRY, SATURATE HAIR AND SCALP WITH PERMETHRIN, RINSE AFTER 10 MINUTES, REPEAT IN 1 WEEK IF NEEDED   120 mL   PRN   . simvastatin (ZOCOR) 40 MG tablet   Oral   Take 1 tablet (40 mg total) by mouth daily.   90 tablet   3   . traMADol (ULTRAM) 50 MG tablet   Oral   Take 50 mg by mouth every 6 (six) hours as needed.         . triamterene-hydrochlorothiazide (DYAZIDE) 37.5-25 MG per capsule   Oral   Take 1 each (1 capsule total) by mouth every morning.   90 capsule   3    BP 154/79  Pulse 96  Temp(Src) 98.9 F (37.2 C) (Oral)  Resp 18  SpO2 98%  LMP 03/29/2013 Physical Exam  Nursing note and vitals reviewed. Constitutional: She is oriented to person, place, and time. She appears well-developed. No distress.  Obese  HENT:  Head: Normocephalic and atraumatic.  Mouth/Throat: Oropharynx is clear and moist.  Eyes: Conjunctivae are normal. No scleral icterus.  Neck: Normal range of motion. Neck supple.  Cardiovascular: Normal rate, regular rhythm and normal heart sounds.   Pulmonary/Chest: Effort normal and breath sounds normal.  Abdominal: Soft. Normal appearance and bowel sounds are normal. She exhibits no distension and no mass. There is tenderness in the right upper quadrant and epigastric area. There is guarding. There is no rigidity, no rebound and negative Murphy's sign.  No peritoneal signs.  Musculoskeletal: Normal range of motion. She exhibits no edema.  Neurological: She is alert and oriented to person, place, and time.  Skin: Skin is warm and dry. She is not diaphoretic.  Psychiatric: She has a normal mood and affect. Her behavior is normal.    ED Course   Procedures (including critical care time)  Labs Reviewed  URINALYSIS, ROUTINE W  REFLEX MICROSCOPIC - Abnormal; Notable for the following:    Color, Urine ORANGE (*)    APPearance CLOUDY (*)    Specific Gravity, Urine 1.032 (*)    Bilirubin Urine MODERATE (*)    Ketones, ur >80 (*)  Protein, ur 100 (*)    Urobilinogen, UA 4.0 (*)    Nitrite POSITIVE (*)    Leukocytes, UA SMALL (*)    All other components within normal limits  CBC WITH DIFFERENTIAL - Abnormal; Notable for the following:    WBC 13.3 (*)    Neutrophils Relative % 79 (*)    Neutro Abs 10.5 (*)    Lymphocytes Relative 7 (*)    Monocytes Relative 14 (*)    Monocytes Absolute 1.8 (*)    All other components within normal limits  BASIC METABOLIC PANEL - Abnormal; Notable for the following:    Sodium 132 (*)    Potassium 3.2 (*)    Chloride 92 (*)    Glucose, Bld 150 (*)    All other components within normal limits  HEPATIC FUNCTION PANEL - Abnormal; Notable for the following:    Total Protein 8.4 (*)    Bilirubin, Direct 0.4 (*)    All other components within normal limits  URINE MICROSCOPIC-ADD ON - Abnormal; Notable for the following:    Squamous Epithelial / LPF MANY (*)    Bacteria, UA MANY (*)    Casts HYALINE CASTS (*)    All other components within normal limits   US Abdomen Complete  05/08/2013   *RADIOLOGY REPORT*  Clinical Data:  Abdominal / epigastric pain.  Question pancreatitis versus gallstones.  ABDOMINAL ULTRASOUND COMPLETE  Comparison:  None.  Findings:  Gallbladder:  There is a dominant 2 cm gallstone in the region of the gallbladder neck that was immobile during the examination. There is some sludge within the gallbladder lumen.  Gallbladder wall thickness is mildly prominent, measuring approximately 4.8 mm. Trace amount of pericholecystic fluid is noted.  The sonographer states that the sonographic Murphy's sign is negative at the time of the exam.  Common Bile Duct:  Within normal limits in caliber. Measures 4.4 mm.  Liver: No focal mass lesion identified.  Within normal limits  in parenchymal echogenicity. The portal vein is patent and demonstrates hepatopedal flow.  IVC:  Appears normal.  Pancreas:  Although the pancreas is difficult to visualize in its entirety, no focal pancreatic abnormality is identified.  Spleen:  Within normal limits in size and echotexture.  Right kidney:  Normal in size and parenchymal echogenicity.  No evidence of mass or hydronephrosis.  Left kidney:  Normal in size and parenchymal echogenicity.  No evidence of mass or hydronephrosis.  Abdominal Aorta:  No aneurysm identified.  IMPRESSION:  There is an immobile 2 cm gallstone in the gallbladder neck and the gallbladder wall is mildly thickened with a trace amount of pericholecystic fluid present. The sonographer reports that the sonographic Murphy's sign is negative. Early acute cholecystitis cannot be excluded.  Nuclear medicine hepatobiliary scan could be considered for further evaluation if clinically indicated.   Original Report Authenticated By: Britta Mccreedy, M.D.   1. Cholelithiases   2. UTI (urinary tract infection)     MDM  Patient with possible acute cholecystitis from gallstones seen on abdominal US earlier today. Will repeat labs and call surgery for consult. Dilaudid, zofran, fluids. She is in NAD, afebrile. Labs were obtained in triage prior to patient being seen- cmp was not included, will add on LFTs. 1:31 PM LFTs normal. Mild leukocytosis of 13.3 I spoke with Aris Georgia, PA with [facility], will evaluate patient for admission d/t symptomatic cholelithiasis. Patient discussed with Dr. Anitra Lauth who agrees with plan of care. 2:41 PM 1g IV rocephin for UTI. 3:40  PM Patient admitted by surgery for procedure tomorrow.  Trevor Mace, PA-C 05/08/13 1540

## 2013-05-08 NOTE — ED Notes (Signed)
abd pain states that she was having gallbladder issues and was sent here to see a surgeon per pt.

## 2013-05-08 NOTE — Telephone Encounter (Signed)
Spoke with pt advised her of Regina's message. 

## 2013-05-08 NOTE — H&P (Signed)
Jody [name] is an 52 y.o. female.   Chief Complaint: nausea, vomiting, and RUQ/epigastric pain HPI: Jody [name] is a 53 yo [place] American female with known history of GERD, Hepatitis C, and irritable bowel symptoms. She presents to [facility] for with persistent pain in RUQ and epigastrum since Sunday. The pain is sharp and started after eating tuna sandwich. Pain is stabbing and comes and goes about 2 hours after eating. Also has worsening nausea and vomiting since Sunday and has not eaten anything for 3 days due to nausea. Is drinking minimal fluids. Tried taking PPI 3x a day from 2x day to relieve symptoms with no result.  Lying down relieves the pain. Has not had a BM in 2 days. Usually has 6-8 BM each day. Has had heartburn and associated chest pain through out acute illness that subsides when she takes PPI. Has not experiences this before. Denies fever, chills, dysphagia, hematemesis, diarrhea, blood in stool, blood in vomit, or distension.  Past Medical History  Diagnosis Date  . GERD (gastroesophageal reflux disease)   . Hepatitis C   . Hypertension   . Hyperlipidemia   . PPD positive   . Bowel habit changes     Past Surgical History  Procedure Laterality Date  . Tonsillectomy  1978  . Tubal ligation  02/1987  . Appendectomy  1990  . Colonoscopy  2009, July 2014    found polyps 2014  . Umbilical surg      removed naval due to keloids/1995  . Cessarian  1988    twins    Family History  Problem Relation Age of Onset  . Cancer Mother     Colon Cancer  . Hyperlipidemia Mother   . Hypertension Mother   . Colon cancer Mother   . Hypertension Father   . Diabetes Father   . Diabetes Sister   . Diabetes Maternal Grandfather    Social History:  reports that she has been smoking Cigarettes.  She has been smoking about 0.00 packs per day. She has never used smokeless tobacco. She reports that she drinks about 2.2 ounces of alcohol per week. She reports that she does not use  illicit drugs.  Allergies: No Known Allergies   (Not in a hospital admission)  Results for orders placed during the hospital encounter of 05/08/13 (from the past 48 hour(s))  CBC WITH DIFFERENTIAL     Status: Abnormal   Collection Time    05/08/13 12:00 PM      Result Value Range   WBC 13.3 (*) 4.0 - 10.5 K/uL   RBC 4.63  3.87 - 5.11 MIL/uL   Hemoglobin 13.7  12.0 - 15.0 g/dL   HCT 16.1  09.6 - 04.5 %   MCV 86.8  78.0 - 100.0 fL   MCH 29.6  26.0 - 34.0 pg   MCHC 34.1  30.0 - 36.0 g/dL   RDW 40.9  81.1 - 91.4 %   Platelets 211  150 - 400 K/uL   Neutrophils Relative % 79 (*) 43 - 77 %   Neutro Abs 10.5 (*) 1.7 - 7.7 K/uL   Lymphocytes Relative 7 (*) 12 - 46 %   Lymphs Abs 0.9  0.7 - 4.0 K/uL   Monocytes Relative 14 (*) 3 - 12 %   Monocytes Absolute 1.8 (*) 0.1 - 1.0 K/uL   Eosinophils Relative 0  0 - 5 %   Eosinophils Absolute 0.0  0.0 - 0.7 K/uL   Basophils Relative 0  0 - 1 %   Basophils Absolute 0.0  0.0 - 0.1 K/uL  BASIC METABOLIC PANEL     Status: Abnormal   Collection Time    05/08/13 12:00 PM      Result Value Range   Sodium 132 (*) 135 - 145 mEq/L   Potassium 3.2 (*) 3.5 - 5.1 mEq/L   Chloride 92 (*) 96 - 112 mEq/L   CO2 26  19 - 32 mEq/L   Glucose, Bld 150 (*) 70 - 99 mg/dL   BUN 8  6 - 23 mg/dL   Creatinine, Ser 3.08  0.50 - 1.10 mg/dL   Calcium 9.9  8.4 - 65.7 mg/dL   GFR calc non Af Amer >90  >90 mL/min   GFR calc Af Amer >90  >90 mL/min   Comment:            The eGFR has been calculated     using the CKD EPI equation.     This calculation has not been     validated in all clinical     situations.     eGFR's persistently     <90 mL/min signify     possible Chronic Kidney Disease.  HEPATIC FUNCTION PANEL     Status: Abnormal   Collection Time    05/08/13 12:00 PM      Result Value Range   Total Protein 8.4 (*) 6.0 - 8.3 g/dL   Albumin 3.8  3.5 - 5.2 g/dL   AST 17  0 - 37 U/L   ALT 10  0 - 35 U/L   Alkaline Phosphatase 68  39 - 117 U/L   Total  Bilirubin 1.2  0.3 - 1.2 mg/dL   Bilirubin, Direct 0.4 (*) 0.0 - 0.3 mg/dL   Indirect Bilirubin 0.8  0.3 - 0.9 mg/dL  URINALYSIS, ROUTINE W REFLEX MICROSCOPIC     Status: Abnormal   Collection Time    05/08/13 12:57 PM      Result Value Range   Color, Urine ORANGE (*) YELLOW   Comment: BIOCHEMICALS MAY BE AFFECTED BY COLOR   APPearance CLOUDY (*) CLEAR   Specific Gravity, Urine 1.032 (*) 1.005 - 1.030   pH 6.5  5.0 - 8.0   Glucose, UA NEGATIVE  NEGATIVE mg/dL   Hgb urine dipstick NEGATIVE  NEGATIVE   Bilirubin Urine MODERATE (*) NEGATIVE   Ketones, ur >80 (*) NEGATIVE mg/dL   Protein, ur 846 (*) NEGATIVE mg/dL   Urobilinogen, UA 4.0 (*) 0.0 - 1.0 mg/dL   Nitrite POSITIVE (*) NEGATIVE   Leukocytes, UA SMALL (*) NEGATIVE  URINE MICROSCOPIC-ADD ON     Status: Abnormal   Collection Time    05/08/13 12:57 PM      Result Value Range   Squamous Epithelial / LPF MANY (*) RARE   WBC, UA 0-2  <3 WBC/hpf   RBC / HPF 3-6  <3 RBC/hpf   Bacteria, UA MANY (*) RARE   Casts HYALINE CASTS (*) NEGATIVE   Urine-Other RARE YEAST     US Abdomen Complete  05/08/2013   *RADIOLOGY REPORT*  Clinical Data:  Abdominal / epigastric pain.  Question pancreatitis versus gallstones.  ABDOMINAL ULTRASOUND COMPLETE  Comparison:  None.  Findings:  Gallbladder:  There is a dominant 2 cm gallstone in the region of the gallbladder neck that was immobile during the examination. There is some sludge within the gallbladder lumen.  Gallbladder wall thickness is mildly prominent, measuring approximately 4.8 mm. Trace amount of  pericholecystic fluid is noted.  The sonographer states that the sonographic Murphy's sign is negative at the time of the exam.  Common Bile Duct:  Within normal limits in caliber. Measures 4.4 mm.  Liver: No focal mass lesion identified.  Within normal limits in parenchymal echogenicity. The portal vein is patent and demonstrates hepatopedal flow.  IVC:  Appears normal.  Pancreas:  Although the pancreas  is difficult to visualize in its entirety, no focal pancreatic abnormality is identified.  Spleen:  Within normal limits in size and echotexture.  Right kidney:  Normal in size and parenchymal echogenicity.  No evidence of mass or hydronephrosis.  Left kidney:  Normal in size and parenchymal echogenicity.  No evidence of mass or hydronephrosis.  Abdominal Aorta:  No aneurysm identified.  IMPRESSION:  There is an immobile 2 cm gallstone in the gallbladder neck and the gallbladder wall is mildly thickened with a trace amount of pericholecystic fluid present. The sonographer reports that the sonographic Murphy's sign is negative. Early acute cholecystitis cannot be excluded.  Nuclear medicine hepatobiliary scan could be considered for further evaluation if clinically indicated.   Original Report Authenticated By: Britta Mccreedy, M.D.    Review of Systems  Constitutional: Negative for fever, chills, weight loss and diaphoresis.       Weakness due to lack of food.  HENT: Negative for congestion and sore throat.   Eyes: Negative for blurred vision, double vision, photophobia and pain.  Respiratory: Negative for cough, hemoptysis, sputum production, shortness of breath and wheezing.   Cardiovascular: Positive for chest pain. Negative for palpitations and leg swelling.       Chest pain typical with her GERD.  Gastrointestinal: Positive for heartburn, nausea, vomiting, abdominal pain and constipation. Negative for diarrhea and blood in stool.  Genitourinary: Negative for dysuria, urgency, frequency and hematuria.  Musculoskeletal: Negative.   Skin: Negative for itching and rash.  Neurological: Positive for weakness and headaches.  Endo/Heme/Allergies: Negative.     Blood pressure 148/86, pulse 89, temperature 98.9 F (37.2 C), temperature source Oral, resp. rate 16, last menstrual period 03/29/2013, SpO2 95.00%. Physical Exam  Constitutional: She is oriented to person, place, and time. She appears  well-developed and well-nourished. She appears ill. No distress.  HENT:  Head: Normocephalic and atraumatic.  Nose: Nose normal.  Mouth/Throat: Oropharynx is clear and moist.  Eyes: Conjunctivae and EOM are normal. Pupils are equal, round, and reactive to light. Right eye exhibits no discharge. Left eye exhibits no discharge. No scleral icterus.  Neck: No tracheal deviation present. No thyromegaly present.  Cardiovascular: Normal rate and regular rhythm.  Exam reveals no gallop and no friction rub.   Murmur heard. Holosystolic murmur heard at right sternal boarder  Respiratory: Effort normal. No respiratory distress. She has no wheezes. She has no rales. She exhibits no tenderness.  GI: Soft. Normal appearance. She exhibits no distension, no fluid wave, no abdominal bruit, no ascites and no mass. Bowel sounds are decreased. There is no hepatosplenomegaly. There is tenderness in the right upper quadrant and epigastric area. There is positive Murphy's sign. There is no rebound, no guarding and no tenderness at McBurney's point. No hernia.  Musculoskeletal: She exhibits no edema and no tenderness.  Lymphadenopathy:    She has no cervical adenopathy.  Neurological: She is alert and oriented to person, place, and time. Coordination abnormal.  Skin: Skin is warm and dry. No rash noted. She is not diaphoretic. No erythema. No pallor.  Psychiatric: She has a normal  mood and affect. Her behavior is normal. Judgment and thought content normal.  Slighly sedated due to recent Dilaudid admin     Assessment Sissy Hoff, 52 yo female  Patients history and U/S finding consistent with acute cholecystitis  Cholelithiasis  Nausea  Vomiting  RUQ/Epigastric Abdominal Pain Plan 1. Since pain is unresolved and there is 2cm gallstone lodged in gallbladder neck, her symptoms are likely to persist so will likely need to prepare for laparoscopic cholecystectomy tomorrow. 2. Diet: NPO 3. IV fluids, pain  medicine, antiemetics 4. Antibiotics 5. Admit to Med-surg floor  Georgina Quint, PA-S [facility] Physician Assistant Student [facility] 05/08/2013, 2:50 PM

## 2013-05-08 NOTE — ED Notes (Signed)
Pt aware of the need for a urine sample. 

## 2013-05-09 ENCOUNTER — Encounter (HOSPITAL_COMMUNITY): Payer: Self-pay | Admitting: Anesthesiology

## 2013-05-09 ENCOUNTER — Inpatient Hospital Stay (HOSPITAL_COMMUNITY): Payer: 59 | Admitting: Anesthesiology

## 2013-05-09 ENCOUNTER — Encounter (HOSPITAL_COMMUNITY): Admission: RE | Disposition: A | Discharge status: Home / Self Care | Payer: Self-pay | Source: Ambulatory Visit

## 2013-05-09 DIAGNOSIS — K8000 Calculus of gallbladder with acute cholecystitis without obstruction: Secondary | ICD-10-CM

## 2013-05-09 HISTORY — PX: CHOLECYSTECTOMY: SHX55

## 2013-05-09 LAB — COMPREHENSIVE METABOLIC PANEL
ALT: 10 U/L (ref 0–35)
AST: 16 U/L (ref 0–37)
Albumin: 2.7 g/dL — ABNORMAL LOW (ref 3.5–5.2)
Alkaline Phosphatase: 65 U/L (ref 39–117)
Chloride: 94 mEq/L — ABNORMAL LOW (ref 96–112)
Potassium: 3.2 mEq/L — ABNORMAL LOW (ref 3.5–5.1)
Sodium: 130 mEq/L — ABNORMAL LOW (ref 135–145)
Total Bilirubin: 1.3 mg/dL — ABNORMAL HIGH (ref 0.3–1.2)

## 2013-05-09 LAB — CBC
MCH: 28.9 pg (ref 26.0–34.0)
MCV: 87.6 fL (ref 78.0–100.0)
Platelets: 167 10*3/uL (ref 150–400)
RDW: 14.7 % (ref 11.5–15.5)

## 2013-05-09 LAB — H PYLORI, IGM, IGG, IGA AB
H Pylori IgG: <0.9 U/mL (ref 0.0–0.8)
H. pylori, IgA Abs: <9 units (ref 0.0–8.9)
H. pylori, IgM Abs: <9 units (ref 0.0–8.9)

## 2013-05-09 SURGERY — LAPAROSCOPIC CHOLECYSTECTOMY WITH INTRAOPERATIVE CHOLANGIOGRAM
Anesthesia: General | Wound class: Clean Contaminated

## 2013-05-09 MED ORDER — KCL IN DEXTROSE-NACL 30-5-0.45 MEQ/L-%-% IV SOLN
INTRAVENOUS | Status: DC
  Administered 2013-05-09 – 2013-05-10 (×2): via INTRAVENOUS
  Filled 2013-05-09 (×5): qty 1000

## 2013-05-09 MED ORDER — LACTATED RINGERS IR SOLN
Status: DC | PRN
  Administered 2013-05-09: 1000 mL

## 2013-05-09 MED ORDER — MIDAZOLAM HCL 5 MG/5ML IJ SOLN
INTRAMUSCULAR | Status: DC | PRN
  Administered 2013-05-09: 1 mg via INTRAVENOUS

## 2013-05-09 MED ORDER — HYDROMORPHONE HCL PF 1 MG/ML IJ SOLN
INTRAMUSCULAR | Status: AC
  Filled 2013-05-09: qty 1

## 2013-05-09 MED ORDER — KETAMINE HCL 10 MG/ML IJ SOLN
INTRAMUSCULAR | Status: DC | PRN
  Administered 2013-05-09: 15 mg via INTRAVENOUS

## 2013-05-09 MED ORDER — FENTANYL CITRATE 0.05 MG/ML IJ SOLN
25.0000 ug | INTRAMUSCULAR | Status: DC | PRN
  Administered 2013-05-09 (×2): 50 ug via INTRAVENOUS

## 2013-05-09 MED ORDER — ESMOLOL HCL 10 MG/ML IV SOLN
INTRAVENOUS | Status: DC | PRN
  Administered 2013-05-09: 10 mg via INTRAVENOUS
  Administered 2013-05-09: 15 mg via INTRAVENOUS

## 2013-05-09 MED ORDER — LIDOCAINE HCL (CARDIAC) 20 MG/ML IV SOLN
INTRAVENOUS | Status: DC | PRN
  Administered 2013-05-09: 30 mg via INTRAVENOUS

## 2013-05-09 MED ORDER — ALPRAZOLAM 0.5 MG PO TABS: 0.5000 mg | ORAL_TABLET | Freq: Every evening | ORAL | Status: DC | PRN

## 2013-05-09 MED ORDER — BUPIVACAINE-EPINEPHRINE 0.25% -1:200000 IJ SOLN
INTRAMUSCULAR | Status: DC | PRN
  Administered 2013-05-09: 20 mL

## 2013-05-09 MED ORDER — PNEUMOCOCCAL VAC POLYVALENT 25 MCG/0.5ML IJ INJ
0.5000 mL | INJECTION | INTRAMUSCULAR | Status: AC
  Administered 2013-05-10: 0.5 mL via INTRAMUSCULAR
  Filled 2013-05-09 (×2): qty 0.5

## 2013-05-09 MED ORDER — PNEUMOCOCCAL VAC POLYVALENT 25 MCG/0.5ML IJ INJ
0.5000 mL | INJECTION | INTRAMUSCULAR | Status: DC
  Filled 2013-05-09: qty 0.5

## 2013-05-09 MED ORDER — ONDANSETRON HCL 4 MG PO TABS: 4.0000 mg | ORAL_TABLET | Freq: Three times a day (TID) | ORAL | Status: DC | PRN

## 2013-05-09 MED ORDER — GLYCOPYRROLATE 0.2 MG/ML IJ SOLN
INTRAMUSCULAR | Status: DC | PRN
  Administered 2013-05-09: 0.6 mg via INTRAVENOUS

## 2013-05-09 MED ORDER — LACTATED RINGERS IV SOLN
INTRAVENOUS | Status: DC | PRN
  Administered 2013-05-09 (×2): via INTRAVENOUS

## 2013-05-09 MED ORDER — ONDANSETRON HCL 4 MG/2ML IJ SOLN
INTRAMUSCULAR | Status: DC | PRN
  Administered 2013-05-09: 4 mg via INTRAVENOUS

## 2013-05-09 MED ORDER — 0.9 % SODIUM CHLORIDE (POUR BTL) OPTIME
TOPICAL | Status: DC | PRN
  Administered 2013-05-09: 1000 mL

## 2013-05-09 MED ORDER — ACETAMINOPHEN 325 MG PO TABS: 650.0000 mg | ORAL_TABLET | ORAL | Status: DC | PRN

## 2013-05-09 MED ORDER — NEOSTIGMINE METHYLSULFATE 1 MG/ML IJ SOLN
INTRAMUSCULAR | Status: DC | PRN
  Administered 2013-05-09: 5 mg via INTRAVENOUS

## 2013-05-09 MED ORDER — FENTANYL CITRATE 0.05 MG/ML IJ SOLN
INTRAMUSCULAR | Status: DC | PRN
  Administered 2013-05-09 (×5): 50 ug via INTRAVENOUS
  Administered 2013-05-09 (×2): 25 ug via INTRAVENOUS
  Administered 2013-05-09 (×2): 50 ug via INTRAVENOUS

## 2013-05-09 MED ORDER — HYDROCODONE-ACETAMINOPHEN 5-325 MG PO TABS
1.0000 | ORAL_TABLET | ORAL | Status: DC | PRN
  Administered 2013-05-09 – 2013-05-11 (×7): 2 via ORAL
  Filled 2013-05-09 (×7): qty 2

## 2013-05-09 MED ORDER — IOHEXOL 300 MG/ML  SOLN
INTRAMUSCULAR | Status: AC
  Filled 2013-05-09: qty 1

## 2013-05-09 MED ORDER — BUPIVACAINE-EPINEPHRINE PF 0.25-1:200000 % IJ SOLN
INTRAMUSCULAR | Status: AC
  Filled 2013-05-09: qty 30

## 2013-05-09 MED ORDER — PROPOFOL 10 MG/ML IV BOLUS
INTRAVENOUS | Status: DC | PRN
  Administered 2013-05-09: 200 mg via INTRAVENOUS

## 2013-05-09 MED ORDER — LACTATED RINGERS IV SOLN
INTRAVENOUS | Status: DC
  Administered 2013-05-09: 17:00:00 via INTRAVENOUS

## 2013-05-09 MED ORDER — FENTANYL CITRATE 0.05 MG/ML IJ SOLN
INTRAMUSCULAR | Status: AC
  Filled 2013-05-09: qty 2

## 2013-05-09 MED ORDER — CISATRACURIUM BESYLATE (PF) 10 MG/5ML IV SOLN
INTRAVENOUS | Status: DC | PRN
  Administered 2013-05-09: 10 mg via INTRAVENOUS
  Administered 2013-05-09: 4 mg via INTRAVENOUS

## 2013-05-09 SURGICAL SUPPLY — 35 items
APPLIER CLIP ROT 10 11.4 M/L (STAPLE) ×2
BENZOIN TINCTURE PRP APPL 2/3 (GAUZE/BANDAGES/DRESSINGS) ×2 IMPLANT
CABLE HIGH FREQUENCY MONO STRZ (ELECTRODE) ×2 IMPLANT
CANISTER SUCTION 2500CC (MISCELLANEOUS) ×2 IMPLANT
CHLORAPREP W/TINT 26ML (MISCELLANEOUS) ×2 IMPLANT
CLIP APPLIE ROT 10 11.4 M/L (STAPLE) ×1 IMPLANT
CLOTH BEACON ORANGE TIMEOUT ST (SAFETY) ×2 IMPLANT
COVER MAYO STAND STRL (DRAPES) ×2 IMPLANT
DECANTER SPIKE VIAL GLASS SM (MISCELLANEOUS) ×2 IMPLANT
DRAPE C-ARM 42X120 X-RAY (DRAPES) ×2 IMPLANT
DRAPE LAPAROSCOPIC ABDOMINAL (DRAPES) ×2 IMPLANT
DRAPE UTILITY XL STRL (DRAPES) ×2 IMPLANT
ELECT REM PT RETURN 9FT ADLT (ELECTROSURGICAL) ×2
ELECTRODE REM PT RTRN 9FT ADLT (ELECTROSURGICAL) ×1 IMPLANT
GLOVE BIOGEL PI IND STRL 7.0 (GLOVE) ×1 IMPLANT
GLOVE BIOGEL PI INDICATOR 7.0 (GLOVE) ×1
GLOVE SURG ORTHO 8.0 STRL STRW (GLOVE) ×2 IMPLANT
GOWN STRL NON-REIN LRG LVL3 (GOWN DISPOSABLE) IMPLANT
GOWN STRL REIN XL XLG (GOWN DISPOSABLE) ×4 IMPLANT
HEMOSTAT SURGICEL 4X8 (HEMOSTASIS) IMPLANT
KIT BASIN OR (CUSTOM PROCEDURE TRAY) ×2 IMPLANT
NS IRRIG 1000ML POUR BTL (IV SOLUTION) ×2 IMPLANT
POUCH SPECIMEN RETRIEVAL 10MM (ENDOMECHANICALS) ×2 IMPLANT
SCISSORS LAP 5X35 DISP (ENDOMECHANICALS) IMPLANT
SET CHOLANGIOGRAPH MIX (MISCELLANEOUS) ×2 IMPLANT
SET IRRIG TUBING LAPAROSCOPIC (IRRIGATION / IRRIGATOR) ×2 IMPLANT
SLEEVE XCEL OPT CAN 5 100 (ENDOMECHANICALS) ×2 IMPLANT
SOLUTION ANTI FOG 6CC (MISCELLANEOUS) ×2 IMPLANT
STRIP CLOSURE SKIN 1/2X4 (GAUZE/BANDAGES/DRESSINGS) ×2 IMPLANT
SUT MNCRL AB 4-0 PS2 18 (SUTURE) ×2 IMPLANT
TOWEL OR 17X26 10 PK STRL BLUE (TOWEL DISPOSABLE) ×2 IMPLANT
TRAY LAP CHOLE (CUSTOM PROCEDURE TRAY) ×2 IMPLANT
TROCAR XCEL BLUNT TIP 100MML (ENDOMECHANICALS) ×2 IMPLANT
TROCAR XCEL NON-BLD 11X100MML (ENDOMECHANICALS) ×2 IMPLANT
TUBING INSUFFLATION 10FT LAP (TUBING) ×2 IMPLANT

## 2013-05-09 NOTE — Transfer of Care (Signed)
Immediate Anesthesia Transfer of Care Note  Patient: Jody [name]  Procedure(s) Performed: Procedure(s): LAPAROSCOPIC CHOLECYSTECTOMY (N/A)  Patient Location: PACU  Anesthesia Type:General  Level of Consciousness: awake, alert , oriented and patient cooperative  Airway & Oxygen Therapy: Patient Spontanous Breathing and Patient connected to nasal cannula oxygen  Post-op Assessment: Report given to PACU RN and Post -op Vital signs reviewed and stable  Post vital signs: stable  Complications: No apparent anesthesia complications

## 2013-05-09 NOTE — Brief Op Note (Signed)
05/08/2013 - 05/09/2013  4:24 PM  PATIENT:  Jody [name]  52 y.o. female  PRE-OPERATIVE DIAGNOSIS:  Cholelithiasis, cholecystitis  POST-OPERATIVE DIAGNOSIS:  Acute gangrenous cholecystitis, cholelithiasis  PROCEDURE:  Procedure(s): LAPAROSCOPIC CHOLECYSTECTOMY (N/A)  SURGEON:  Surgeon(s) and Role:    * [name], MD - Primary  ANESTHESIA:   general  EBL:  Total I/O In: 1000 [I.V.:1000] Out: 30 [Blood:30]  BLOOD ADMINISTERED:none  DRAINS: (19Fr) Blake drain(s) in the subhepatic space   LOCAL MEDICATIONS USED:  MARCAINE     SPECIMEN:  Excision  DISPOSITION OF SPECIMEN:  PATHOLOGY  COUNTS:  YES  TOURNIQUET:  * No tourniquets in log *  DICTATION: .Other Dictation: Dictation Number [id number]  PLAN OF CARE:  observatioin  PATIENT DISPOSITION:  PACU - hemodynamically stable.   Delay start of Pharmacological VTE agent (>24hrs) due to surgical blood loss or risk of bleeding: yes  [name], MD, [facility] Office: [phone]

## 2013-05-09 NOTE — Progress Notes (Signed)
Patient ID: Jody [name], female   DOB: 11-26-1960, 52 y.o.   MRN: [id number]  General Surgery - [facility] - Progress Note  HD# 2  Subjective: Patient with persistent mild pain.  Anticipating surgery today for gallbladder.  Objective: Vital signs in last 24 hours: Temp:  [97.8 F (36.6 C)-100.8 F (38.2 C)] 100.1 F (37.8 C) (07/26 0446) Pulse Rate:  [84-102] 102 (07/26 0446) Resp:  [16-18] 16 (07/26 0446) BP: (101-158)/(65-86) 101/65 mmHg (07/26 0446) SpO2:  [92 %-99 %] 95 % (07/26 0446) Weight:  [236 lb (107.049 kg)] 236 lb (107.049 kg) (07/25 1630) Last BM Date: 05/07/13  Intake/Output from previous day: 07/25 0701 - 07/26 0700 In: 998.3 [P.O.:240; I.V.:558.3; IV Piggyback:200] Out: 800 [Urine:800]  Exam: HEENT - clear, not icteric Neck - soft Chest - clear bilaterally Cor - RRR, no murmur Abd - soft, obese; well healed incision at umbilicus; mild tenderness RUQ Ext - no significant edema Neuro - grossly intact, no focal deficits  Lab Results:   Recent Labs  05/08/13 1200 05/09/13 0439  WBC 13.3* 11.5*  HGB 13.7 11.2*  HCT 40.2 34.0*  PLT 211 167     Recent Labs  05/08/13 1200 05/09/13 0439  NA 132* 130*  K 3.2* 3.2*  CL 92* 94*  CO2 26 26  GLUCOSE 150* 137*  BUN 8 6  CREATININE 0.64 0.62  CALCIUM 9.9 8.4    Studies/Results: US Abdomen Complete  05/08/2013   *RADIOLOGY REPORT*  Clinical Data:  Abdominal / epigastric pain.  Question pancreatitis versus gallstones.  ABDOMINAL ULTRASOUND COMPLETE  Comparison:  None.  Findings:  Gallbladder:  There is a dominant 2 cm gallstone in the region of the gallbladder neck that was immobile during the examination. There is some sludge within the gallbladder lumen.  Gallbladder wall thickness is mildly prominent, measuring approximately 4.8 mm. Trace amount of pericholecystic fluid is noted.  The sonographer states that the sonographic Murphy's sign is negative at the time of the exam.   Common Bile Duct:  Within normal limits in caliber. Measures 4.4 mm.  Liver: No focal mass lesion identified.  Within normal limits in parenchymal echogenicity. The portal vein is patent and demonstrates hepatopedal flow.  IVC:  Appears normal.  Pancreas:  Although the pancreas is difficult to visualize in its entirety, no focal pancreatic abnormality is identified.  Spleen:  Within normal limits in size and echotexture.  Right kidney:  Normal in size and parenchymal echogenicity.  No evidence of mass or hydronephrosis.  Left kidney:  Normal in size and parenchymal echogenicity.  No evidence of mass or hydronephrosis.  Abdominal Aorta:  No aneurysm identified.  IMPRESSION:  There is an immobile 2 cm gallstone in the gallbladder neck and the gallbladder wall is mildly thickened with a trace amount of pericholecystic fluid present. The sonographer reports that the sonographic Murphy's sign is negative. Early acute cholecystitis cannot be excluded.  Nuclear medicine hepatobiliary scan could be considered for further evaluation if clinically indicated.   Original Report Authenticated By: Britta Mccreedy, M.D.    Assessment / Plan: 1.  Symptomatic cholelithiasis, cholecystitis  Plan lap chole with IOC today  The risks and benefits of the procedure have been discussed at length with the patient.  The patient understands the proposed procedure, potential alternative treatments, and the course of recovery to be expected.  All of the patient's questions have been answered at this time.  The patient wishes to proceed with surgery.  Huey Bienenstock.  Gerrit Friends, MD, [facility] Office: [phone]  05/09/2013

## 2013-05-09 NOTE — Anesthesia Preprocedure Evaluation (Addendum)
Anesthesia Evaluation  Patient identified by MRN, date of birth, ID band Patient awake    Reviewed: Allergy & Precautions, H&P , NPO status , Patient's Chart, lab work & pertinent test results  Airway Mallampati: II TM Distance: >3 FB Neck ROM: full    Dental no notable dental hx. (+) Teeth Intact and Dental Advisory Given   Pulmonary neg pulmonary ROS,  breath sounds clear to auscultation  Pulmonary exam normal       Cardiovascular Exercise Tolerance: Good hypertension, Pt. on medications Rhythm:regular Rate:Normal     Neuro/Psych negative neurological ROS  negative psych ROS   GI/Hepatic negative GI ROS, GERD-  Medicated and Controlled,(+) Hepatitis -, C  Endo/Other  negative endocrine ROSMorbid obesity  Renal/GU negative Renal ROS  negative genitourinary   Musculoskeletal   Abdominal   Peds  Hematology negative hematology ROS (+)   Anesthesia Other Findings   Reproductive/Obstetrics negative OB ROS                         Anesthesia Physical Anesthesia Plan  ASA: III  Anesthesia Plan: General   Post-op Pain Management:    Induction: Intravenous  Airway Management Planned: Oral ETT  Additional Equipment:   Intra-op Plan:   Post-operative Plan: Extubation in OR  Informed Consent: I have reviewed the patients History and Physical, chart, labs and discussed the procedure including the risks, benefits and alternatives for the proposed anesthesia with the patient or authorized representative who has indicated his/her understanding and acceptance.   Dental Advisory Given  Plan Discussed with: CRNA and Surgeon  Anesthesia Plan Comments:        Anesthesia Quick Evaluation

## 2013-05-09 NOTE — Op Note (Signed)
Jody [name], Jody [name] NO.:  [id number]  MEDICAL RECORD NO.:  [id number]  LOCATION:  1534                         FACILITY:  [facility]  PHYSICIAN:  [name], MD      DATE OF BIRTH:  Nov 03, 1960  DATE OF PROCEDURE:  05/09/2013                               OPERATIVE REPORT   PREOPERATIVE DIAGNOSIS:  Cholelithiasis, cholecystitis.  POSTOPERATIVE DIAGNOSES:  Acute gangrenous cholecystitis, cholelithiasis.  PROCEDURE:  Laparoscopic cholecystectomy.  SURGEON:  [name], MD, FACS  ANESTHESIA:  General per Dr. Ronelle Nigh.  ESTIMATED BLOOD LOSS:  50 mL.  PREPARATION:  ChloraPrep.  COMPLICATIONS:  None.  INDICATIONS:  The patient is a 52 year old black female admitted with cholecystitis and cholelithiasis from the emergency department.  The patient developed right upper quadrant abdominal pain approximately 1 week ago.  She was admitted to the General Surgery Service and prepared for the operating room.  BODY OF REPORT:  Procedure was done in OR #1 at the [facility].  The patient was brought to the operating room, placed in supine position on the operating room table.  Following administration of general anesthesia, the patient was positioned and then prepped and draped in the usual strict aseptic fashion.  After ascertaining that an adequate level of anesthesia had been achieved, an incision was made in the midline just above the umbilicus.  Dissection was carried down to the fascia.  Fascia was incised in the midline and the peritoneal cavity was entered cautiously.  A 0 Vicryl pursestring suture was placed in the fascia.  An Hasson cannula was introduced under direct vision and secured with the pursestring suture.  Abdomen was insufflated with carbon dioxide.  Laparoscope was introduced and the abdomen explored.  There were significant adhesions in the right upper quadrant.  The liver was densely adherent to the anterior  abdominal wall along its anterior edge.  There were omental adhesions to the undersurface of the liver and the gallbladder was not visible.  There were adhesions of small bowel to the hernia repair in the lower midline. Good visualization was achieved and there was no evidence of bowel injury upon entry with the umbilical trocar.  Next an operating port was placed in the subxiphoid location under direct vision.  Two additional 5 mm ports were placed along the right costal margin just below the liver edge.  Using gentle blunt dissection, the omentum was mobilized off of the dome of the gallbladder.  The inferior wall of the gallbladder was green and partially necrotic.  With some difficulty, a very thick walled gallbladder was grasped.  Gentle dissection allows for mobilization of the omentum and duodenum away from the gallbladder allowing for visualization down to the neck of the gallbladder.  There was a prominent lymph node present.  Decision was made to take the gallbladder down in a retrograde fashion.  Therefore, dissection was begun at the fundus of the gallbladder.  Using gentle blunt dissection and judicious use of the hook electrocautery, the gallbladder was completely excised from the gallbladder bed.  Dissection around the neck of the gallbladder allows for mobilization of the prominent lymph node.  Lymphatics were divided with the hook  electrocautery.  Cystic duct was identified, doubly clipped, and divided.  Cystic artery was dissected out, doubly clipped, and divided with the hook electrocautery.  The entire gallbladder was then delivered away from the liver and placed into an EndoCatch bag.  It was extracted through the umbilical port.  It was submitted to Pathology.  Right upper quadrant was irrigated with warm saline.  A 19-French Blake drain was brought into the peritoneal cavity and exited through the lateral port site.  Drain was placed under the liver and the  gallbladder bed and secured to the skin with a 3-0 nylon suture.  Abdomen was irrigated with warm saline which was evacuated.  Good hemostasis was noted.  Pneumoperitoneum was released and ports were removed.  A 0 Vicryl pursestring suture was tied securely at the umbilicus.  An additional suture was placed just cephalad to the pursestring closure with a #1 Ethibond simple suture in order to fully close the fascial defect.  Port sites were anesthetized with local anesthetic.  Wounds were closed with interrupted 4-0 Monocryl subcuticular sutures.  Drain was placed to bulb suction.  The patient was awakened from anesthesia and brought to the recovery room.  The patient tolerated the procedure well. length.  [name], MD, [facility] Office: [phone]    TMG/MEDQ  D:  05/09/2013  T:  05/09/2013  Job:  [id number]

## 2013-05-09 NOTE — Anesthesia Postprocedure Evaluation (Signed)
  Anesthesia Post-op Note  Patient: Jody [name]  Procedure(s) Performed: Procedure(s) (LRB): LAPAROSCOPIC CHOLECYSTECTOMY (N/A)  Patient Location: PACU  Anesthesia Type: General  Level of Consciousness: awake and alert   Airway and Oxygen Therapy: Patient Spontanous Breathing  Post-op Pain: mild  Post-op Assessment: Post-op Vital signs reviewed, Patient's Cardiovascular Status Stable, Respiratory Function Stable, Patent Airway and No signs of Nausea or vomiting  Last Vitals:  Filed Vitals:   05/09/13 1720  BP:   Pulse: 94  Temp:   Resp: 21    Post-op Vital Signs: stable   Complications: No apparent anesthesia complications

## 2013-05-09 NOTE — Progress Notes (Signed)
Pt transported via bed to OR by NT.

## 2013-05-10 LAB — COMPREHENSIVE METABOLIC PANEL
ALT: 18 U/L (ref 0–35)
AST: 40 U/L — ABNORMAL HIGH (ref 0–37)
Alkaline Phosphatase: 64 U/L (ref 39–117)
CO2: 30 mEq/L (ref 19–32)
Calcium: 8.5 mg/dL (ref 8.4–10.5)
Glucose, Bld: 132 mg/dL — ABNORMAL HIGH (ref 70–99)
Potassium: 3.1 mEq/L — ABNORMAL LOW (ref 3.5–5.1)
Sodium: 134 mEq/L — ABNORMAL LOW (ref 135–145)
Total Protein: 6.1 g/dL (ref 6.0–8.3)

## 2013-05-10 LAB — CBC
HCT: 30.4 % — ABNORMAL LOW (ref 36.0–46.0)
Hemoglobin: 10.1 g/dL — ABNORMAL LOW (ref 12.0–15.0)
MCH: 29.3 pg (ref 26.0–34.0)
MCHC: 33.2 g/dL (ref 30.0–36.0)
RDW: 14.7 % (ref 11.5–15.5)

## 2013-05-10 NOTE — Progress Notes (Signed)
Patient ID: Jody [name], female   DOB: [birth year], 52 y.o.   MRN: [id number]  General Surgery - [facility] - Progress Note  POD# 1  Subjective: Patient doing well.  No nausea.  Mild pain.  Taking clear liquid diet.  Objective: Vital signs in last 24 hours: Temp:  [97.9 F (36.6 C)-100.3 F (37.9 C)] 98.4 F (36.9 C) (07/27 0530) Pulse Rate:  [83-105] 85 (07/27 0530) Resp:  [16-23] 16 (07/27 0530) BP: (111-156)/(62-90) 111/64 mmHg (07/27 0530) SpO2:  [92 %-100 %] 93 % (07/27 0530) Last BM Date: 05/07/13  Intake/Output from previous day: 07/26 0701 - 07/27 0700 In: 3500 [P.O.:120; I.V.:3180; IV Piggyback:200] Out: 1135 [Urine:1050; Drains:55; Blood:30]  Exam: HEENT - clear, not icteric Neck - soft Chest - clear bilaterally Cor - RRR, no murmur Abd - soft; JP drain with serosanguinous output; dressings dry Ext - no significant edema Neuro - grossly intact, no focal deficits  Lab Results:   Recent Labs  05/09/13 0439 05/10/13 0420  WBC 11.5* 10.0  HGB 11.2* 10.1*  HCT 34.0* 30.4*  PLT 167 161     Recent Labs  05/09/13 0439 05/10/13 0420  NA 130* 134*  K 3.2* 3.1*  CL 94* 98  CO2 26 30  GLUCOSE 137* 132*  BUN 6 4*  CREATININE 0.62 0.60  CALCIUM 8.4 8.5    Studies/Results: No results found.  Assessment / Plan: 1.  Status post lap chole for acute gangrenous cholecystitis  IV Unasyn - will send home on 5 day course of Augmentin  Monitor drain output - will remove in office later this week  Advance diet as tolerated  Ambulate in halls  Anticipate home tomorrow (7/28) if doing well  [name], MD, [facility] Office: [phone]  05/10/2013

## 2013-05-11 ENCOUNTER — Other Ambulatory Visit (INDEPENDENT_AMBULATORY_CARE_PROVIDER_SITE_OTHER): Payer: Self-pay | Admitting: *Deleted

## 2013-05-11 ENCOUNTER — Encounter (HOSPITAL_COMMUNITY): Payer: Self-pay | Admitting: General Surgery

## 2013-05-11 ENCOUNTER — Encounter: Payer: Self-pay | Admitting: General Surgery

## 2013-05-11 DIAGNOSIS — F17200 Nicotine dependence, unspecified, uncomplicated: Secondary | ICD-10-CM

## 2013-05-11 DIAGNOSIS — K8020 Calculus of gallbladder without cholecystitis without obstruction: Secondary | ICD-10-CM

## 2013-05-11 DIAGNOSIS — K219 Gastro-esophageal reflux disease without esophagitis: Secondary | ICD-10-CM

## 2013-05-11 DIAGNOSIS — G8929 Other chronic pain: Secondary | ICD-10-CM | POA: Diagnosis present

## 2013-05-11 DIAGNOSIS — E785 Hyperlipidemia, unspecified: Secondary | ICD-10-CM

## 2013-05-11 DIAGNOSIS — K810 Acute cholecystitis: Secondary | ICD-10-CM

## 2013-05-11 DIAGNOSIS — E669 Obesity, unspecified: Secondary | ICD-10-CM

## 2013-05-11 HISTORY — DX: Gastro-esophageal reflux disease without esophagitis: K21.9

## 2013-05-11 HISTORY — DX: Other chronic pain: G89.29

## 2013-05-11 HISTORY — DX: Calculus of gallbladder without cholecystitis without obstruction: K80.20

## 2013-05-11 HISTORY — DX: Acute cholecystitis: K81.0

## 2013-05-11 HISTORY — DX: Hyperlipidemia, unspecified: E78.5

## 2013-05-11 HISTORY — DX: Nicotine dependence, unspecified, uncomplicated: F17.200

## 2013-05-11 HISTORY — DX: Obesity, unspecified: E66.9

## 2013-05-11 MED ORDER — HYDROCODONE-ACETAMINOPHEN 5-325 MG PO TABS: 1.0000 | ORAL_TABLET | ORAL | Status: DC | PRN

## 2013-05-11 MED ORDER — AMOXICILLIN-POT CLAVULANATE 875-125 MG PO TABS
1.0000 | ORAL_TABLET | Freq: Two times a day (BID) | ORAL | Status: DC
  Filled 2013-05-11: qty 1

## 2013-05-11 MED ORDER — TRIAMTERENE-HCTZ 37.5-25 MG PO TABS
1.0000 | ORAL_TABLET | Freq: Every day | ORAL | Status: DC
  Administered 2013-05-11: 1 via ORAL
  Filled 2013-05-11: qty 1

## 2013-05-11 MED ORDER — HYDROCODONE-ACETAMINOPHEN 5-325 MG PO TABS: ORAL_TABLET | ORAL | Status: DC

## 2013-05-11 MED ORDER — BUPROPION HCL ER (SR) 150 MG PO TB12
150.0000 mg | ORAL_TABLET | Freq: Every day | ORAL | Status: DC
  Administered 2013-05-11: 150 mg via ORAL
  Filled 2013-05-11: qty 1

## 2013-05-11 MED ORDER — PANTOPRAZOLE SODIUM 40 MG PO TBEC: 40.0000 mg | DELAYED_RELEASE_TABLET | Freq: Every day | ORAL | Status: DC

## 2013-05-11 MED ORDER — TRIAMTERENE-HCTZ 37.5-25 MG PO CAPS
1.0000 | ORAL_CAPSULE | Freq: Every day | ORAL | Status: DC
  Filled 2013-05-11: qty 1

## 2013-05-11 MED ORDER — AMOXICILLIN-POT CLAVULANATE 875-125 MG PO TABS: 1.0000 | ORAL_TABLET | Freq: Two times a day (BID) | ORAL | Status: DC

## 2013-05-11 MED ORDER — MELOXICAM 15 MG PO TABS
15.0000 mg | ORAL_TABLET | Freq: Every day | ORAL | Status: DC
  Administered 2013-05-11: 15 mg via ORAL
  Filled 2013-05-11: qty 1

## 2013-05-11 MED ORDER — SIMVASTATIN 40 MG PO TABS
40.0000 mg | ORAL_TABLET | Freq: Every day | ORAL | Status: DC
  Filled 2013-05-11: qty 1

## 2013-05-11 MED ORDER — ACETAMINOPHEN 325 MG PO TABS: 650.0000 mg | ORAL_TABLET | ORAL | Status: DC | PRN

## 2013-05-11 NOTE — Progress Notes (Signed)
2 Days Post-Op  Subjective: Feels good and ready to go home.  Objective: Vital signs in last 24 hours: Temp:  [98.4 F (36.9 C)-98.8 F (37.1 C)] 98.6 F (37 C) (07/28 0500) Pulse Rate:  [74-90] 74 (07/28 0500) Resp:  [16-18] 18 (07/28 0500) BP: (145-154)/(87-92) 145/89 mmHg (07/28 0500) SpO2:  [92 %-95 %] 95 % (07/28 0500) Last BM Date: 05/07/13 Afebrile, VSS,BP up some last 12 hours. K+ is low, LFT's better.  WBC is better, labs from yesterday Diet: fat modified. Intake/Output from previous day: 07/27 0701 - 07/28 0700 In: 2745 [I.V.:2345; IV Piggyback:400] Out: 35 [Drains:35] Intake/Output this shift:    General appearance: alert, cooperative and no distress GI: soft, minimal tenderness, incisions look fine,  drain is clear serous-bloody drainage.  Lab Results:   Recent Labs  05/09/13 0439 05/10/13 0420  WBC 11.5* 10.0  HGB 11.2* 10.1*  HCT 34.0* 30.4*  PLT 167 161    BMET  Recent Labs  05/09/13 0439 05/10/13 0420  NA 130* 134*  K 3.2* 3.1*  CL 94* 98  CO2 26 30  GLUCOSE 137* 132*  BUN 6 4*  CREATININE 0.62 0.60  CALCIUM 8.4 8.5   PT/INR No results found for this basename: LABPROT, INR,  in the last 72 hours   Recent Labs Lab 05/07/13 1345 05/08/13 1200 05/09/13 0439 05/10/13 0420  AST 16 17 16  40*  ALT 14 10 10 18   ALKPHOS 50 68 65 64  BILITOT 1.5* 1.2 1.3* 0.9  PROT 7.7 8.4* 6.7 6.1  ALBUMIN 3.9 3.8 2.7* 2.5*     Lipase     Component Value Date/Time   LIPASE 17 05/09/2013 0439     Studies/Results: No results found.  Medications: . ampicillin-sulbactam (UNASYN) IV  3 g Intravenous Q6H  . pantoprazole (PROTONIX) IV  40 mg Intravenous QHS    Assessment/Plan Acute gangrenous cholecystitis, cholelithiasis. Laparoscopic cholecystectomy. 05/09/2013, [name], MD Hepatitis C GERD (gastroesophageal reflux disease)  Hypertension Hyperlipidemia PPD positive Tobacco use Body mass index is 38.6   Plan:  Home today, on 5  days of antibiotics, she will go to the Office on Friday 8/1 for drain removal, she will see Dr. Gerrit Friends on 8/12 for follow up.  She works at [employer] and I told her she could go back to work on Wed 05/13/13 if she was up to it.  She works in Occupational hygienist, and does clerical work.      LOS: 3 days    Jody [name] 05/11/2013

## 2013-05-11 NOTE — Discharge Summary (Signed)
Physician Discharge Summary  Patient ID: [name] MRN: [id number] DOB/AGE: [birth year] 52 y.o.  Admit date: 05/08/2013 Discharge date: 05/11/2013  Admission Diagnoses:  Cholecystitis, cholelithiasis Hx of Hepatitis C (treated) GERD (gastroesophageal reflux disease)  Hypertension  Hyperlipidemia  PPD positive  Tobacco use  Body mass index is 38.6 Chronic Back pain  Discharge Diagnoses:  Acute gangrenous cholecystitis Principal Problem:   Acute gangrenous cholecystitis Active Problems:   Cholelithiasis   GERD (gastroesophageal reflux disease)   Other and unspecified hyperlipidemia   Tobacco use disorder   Obesity (BMI 35.0-39.9 without comorbidity)   Chronic back pain   Cholelithiasis without obstruction   PROCEDURES: Laparoscopic cholecystectomy. 05/09/2013, [name], MD   Hospital Course:  Jody [name] is a 52 yo [place] American female with known history of GERD, Hepatitis C, and irritable bowel symptoms. She presents to [facility] for with persistent pain in RUQ and epigastrum since Sunday. The pain is sharp and started after eating tuna sandwich. Pain is stabbing and comes and goes about 2 hours after eating. Also has worsening nausea and vomiting since Sunday and has not eaten anything for 3 days due to nausea. Is drinking minimal fluids. Tried taking PPI 3x a day from 2x day to relieve symptoms with no result. Lying down relieves the pain. Has not had a BM in 2 days. Usually has 6-8 BM each day. Has had heartburn and associated chest pain through out acute illness that subsides when she takes PPI. Has not experiences this before. Denies fever, chills, dysphagia, hematemesis, diarrhea, blood in stool, blood in vomit, or distension. She was seen in the ER by Dr. Gerrit Friends, and placed on IV antibiotics and had surgery the following day.  Gallblader was gangrenous.  It was removed and a drain was placed.  She did well post op and was kept on IV Unasyn.  By the 2nd post op  day she was doing well and ready for discharge home.  Dr. Gerrit Friends want her to have the drain removed later this week, this has been arranged, and she will continue with 5 more days of antibiotics.  She will follow up after that with Dr. Gerrit Friends, 05/26/13.  Condition on d/c:  Improved.   Disposition: Home       Future Appointments Provider Department Dept Phone   05/15/2013 10:30 AM [facility] Surgery Nurse [facility], [place] [phone]   05/26/2013 11:45 AM [name], MD [facility], [place] [phone]       Medication List         acetaminophen 325 MG tablet  Commonly known as:  TYLENOL  Take 2 tablets (650 mg total) by mouth every 4 (four) hours as needed.     ALPRAZolam 0.5 MG tablet  Commonly known as:  XANAX  Take 0.5 mg by mouth at bedtime as needed for sleep or anxiety.     amoxicillin-clavulanate 875-125 MG per tablet  Commonly known as:  AUGMENTIN  Take 1 tablet by mouth every 12 (twelve) hours.     buPROPion 150 MG 12 hr tablet  Commonly known as:  WELLBUTRIN SR  Take 1 tablet (150 mg total) by mouth daily.     clobetasol cream 0.05 %  Commonly known as:  TEMOVATE  Apply 1 application topically as needed (skin irritation).     cyclobenzaprine 10 MG tablet  Commonly known as:  FLEXERIL  Take 10 mg by mouth as needed for muscle spasms.     ergocalciferol 50000 UNITS capsule  Commonly known as:  VITAMIN D2  Take 1 capsule (50,000 Units total) by mouth once a week.     gabapentin 100 MG capsule  Commonly known as:  NEURONTIN  Take 100 mg by mouth 2 (two) times daily.     HYDROcodone-acetaminophen 5-325 MG per tablet  Commonly known as:  NORCO/VICODIN  This has tylenol in it.  Do not take more than 3000 mg of Tylenol (acetaminophen) in any 24 hour period.     meloxicam 15 MG tablet  Commonly known as:  MOBIC  Take 15 mg by mouth daily.     ondansetron 4 MG tablet  Commonly known as:  ZOFRAN  Take 1 tablet (4 mg total) by mouth every 8  (eight) hours as needed for nausea.     pantoprazole 40 MG tablet  Commonly known as:  PROTONIX  Take 1 tablet (40 mg total) by mouth 2 (two) times daily.     permethrin 1 % lotion  Commonly known as:  ELIMITE  SHAMPOO, RINSE AND TOWEL DRY, SATURATE HAIR AND SCALP WITH PERMETHRIN, RINSE AFTER 10 MINUTES, REPEAT IN 1 WEEK IF NEEDED     simvastatin 40 MG tablet  Commonly known as:  ZOCOR  Take 1 tablet (40 mg total) by mouth daily.     traMADol 50 MG tablet  Commonly known as:  ULTRAM  Take 50 mg by mouth every 6 (six) hours as needed for pain.     triamterene-hydrochlorothiazide 37.5-25 MG per capsule  Commonly known as:  DYAZIDE  Take 1 each (1 capsule total) by mouth every morning.     VECTICAL 3 MCG/GM cream  Generic drug:  Calcitriol  Apply 1 application topically as needed (skin irritation).       Follow-up Information   Follow up with [facility],MD, MD On 05/15/2013. (You have an appointment at 10:30 to have drain removed.  Be there 30 minutes early for check in.)    Contact information:   [address] [place] [address] [phone]       Follow up with [name], MD On 05/26/2013. (Be at office for check in at 11:30 AM)    Contact information:   [address] [place] [address] [phone]       Follow up with Nicki Reaper, NP. (Let them know you had surgery and follow up for any medical issues.)    Contact information:   [address] [place] [address] [phone]       Signed: Sherrie George 05/11/2013, 9:52 AM

## 2013-05-11 NOTE — Discharge Summary (Signed)
General surgery attending note:  I have interviewed and examined this patient this morning. I discussed her treatment plan with Dr. Gerrit Friends. I agree with the discharge plans.  She will be discharged on antibiotics and she will see Dr. Gerrit Friends in the office later this week in hopes of getting the drain out band. Diet and activities discussed. Hydration stressed.    Jody [name]. Derrell Lolling, M.D., [facility] General and Minimally invasive Surgery Breast and Colorectal Surgery Office:   [phone] Pager:   [phone]

## 2013-05-11 NOTE — Progress Notes (Signed)
Patient interviewed and examined this morning. I agree with the assessment and treatment plan outlined by Mr. Marlyne Beards, [place]  Angelia Mould. Derrell Lolling, M.D., [facility] General and Minimally invasive Surgery Breast and Colorectal Surgery Office:   [phone] Pager:   [phone]

## 2013-05-11 NOTE — Progress Notes (Signed)
This patient is receiving IV Protonix. Based on criteria approved by the Pharmacy and Therapeutics Committee, this medication is being converted to the equivalent oral dose form. These criteria include:   . The patient is eating (either orally or per tube) and/or has been taking other orally administered medications for at least 24 hours.  . This patient has no evidence of active gastrointestinal bleeding or impaired GI absorption (gastrectomy, short bowel, patient on TNA or NPO).   If you have questions about this conversion, please contact the pharmacy department.  Clydene Fake, [facility] 05/11/2013 9:01 AM

## 2013-05-11 NOTE — ED Provider Notes (Signed)
Medical screening examination/treatment/procedure(s) were performed by non-physician practitioner and as supervising physician I was immediately available for consultation/collaboration.   Chayden Garrelts, MD 05/11/13 0659 

## 2013-05-15 ENCOUNTER — Ambulatory Visit (INDEPENDENT_AMBULATORY_CARE_PROVIDER_SITE_OTHER): Payer: Commercial Managed Care - PPO

## 2013-05-15 VITALS — BP 130/76 | HR 68 | Temp 98.0°F | Resp 18 | Ht 66.0 in | Wt 235.0 lb

## 2013-05-15 DIAGNOSIS — Z4803 Encounter for change or removal of drains: Secondary | ICD-10-CM

## 2013-05-15 DIAGNOSIS — Z4889 Encounter for other specified surgical aftercare: Secondary | ICD-10-CM

## 2013-05-15 NOTE — Progress Notes (Signed)
Nurse only arrived; Patient very pleasantly talkative; J.p drain in place no redness , afebrile,or serous drainage noted. Jp with 5 cc light color drainage noted. Patient states 5 cc x 3days; Cleansed area with cholera prep ; removed sutures at drain site; removed drain, cleansed area with cholera prep, Applied 2x2 dressing, secured with paper tape. Patient tolerated well Advised to call if temp 100.3or greater, serous drainage,odor. Patient verbalized understanding

## 2013-05-26 ENCOUNTER — Ambulatory Visit (INDEPENDENT_AMBULATORY_CARE_PROVIDER_SITE_OTHER): Payer: Commercial Managed Care - PPO | Admitting: Surgery

## 2013-05-26 ENCOUNTER — Encounter (INDEPENDENT_AMBULATORY_CARE_PROVIDER_SITE_OTHER): Payer: Self-pay | Admitting: Surgery

## 2013-05-26 VITALS — BP 130/86 | HR 64 | Resp 18 | Ht 65.0 in | Wt 237.6 lb

## 2013-05-26 DIAGNOSIS — K810 Acute cholecystitis: Secondary | ICD-10-CM

## 2013-05-26 NOTE — Progress Notes (Signed)
General Surgery [facility]  Visit Diagnoses: 1. Acute gangrenous cholecystitis     HISTORY: Patient is a 52 year old female who underwent urgent cholecystectomy for acute gangrenous cholecystitis. Final pathology is benign. She returns today for first postoperative visit.  EXAM: Surgical incisions are well-healed. No sign of infection. No sign of herniation. Right upper quadrant is soft and minimally tender to examination.  IMPRESSION: Status post laparoscopic cholecystectomy for acute gangrenous cholecystitis  PLAN: The patient will apply topical creams to her incisions. She is released to full activity without restriction. She will return for surgical care as needed.  [name], MD, FACS General & Endocrine Surgery [facility]

## 2013-05-26 NOTE — Patient Instructions (Signed)
  COCOA BUTTER & VITAMIN E CREAM  (Palmer's or other brand)  Apply cocoa butter/vitamin E cream to your incision 2 - 3 times daily.  Massage cream into incision for one minute with each application.  Use sunscreen (50 SPF or higher) for first 6 months after surgery if area is exposed to sun.  You may substitute Mederma or other scar reducing creams as desired.   

## 2013-06-19 ENCOUNTER — Telehealth: Payer: Self-pay | Admitting: Internal Medicine

## 2013-06-19 ENCOUNTER — Other Ambulatory Visit: Payer: Self-pay | Admitting: Internal Medicine

## 2013-06-19 MED ORDER — CLOBETASOL PROPIONATE 0.05 % EX CREA: 1.0000 "application " | TOPICAL_CREAM | CUTANEOUS | Status: DC | PRN

## 2013-06-19 NOTE — Telephone Encounter (Signed)
Pt called request new rx to be send in for Calobetasol Propionate cream. Pt stated she got this cream from her old dermatologist; she is not seeing that doctor anymore and she can not get an appt with the new dermatologist until another month. Please advise. [facility].

## 2013-06-19 NOTE — Telephone Encounter (Signed)
rx done

## 2013-06-19 NOTE — Telephone Encounter (Signed)
Called pt to inform that rx is done. Can not leave vm, will try again later.

## 2013-08-20 ENCOUNTER — Other Ambulatory Visit: Payer: Self-pay

## 2013-09-02 ENCOUNTER — Telehealth: Payer: Self-pay | Admitting: Internal Medicine

## 2013-09-02 NOTE — Telephone Encounter (Signed)
Patient dropped off FMLA to be completed for time she was out in July due to Gallbladder problems, forms completed and sent to [facility] for signature. Will notify patient when ready for pick up

## 2013-09-02 NOTE — Telephone Encounter (Signed)
Will sign as soon as I get the forms

## 2013-09-03 DIAGNOSIS — Z0279 Encounter for issue of other medical certificate: Secondary | ICD-10-CM

## 2013-10-20 ENCOUNTER — Other Ambulatory Visit: Payer: Self-pay | Admitting: Internal Medicine

## 2013-10-23 ENCOUNTER — Other Ambulatory Visit: Payer: Self-pay | Admitting: Internal Medicine

## 2013-10-28 ENCOUNTER — Ambulatory Visit (INDEPENDENT_AMBULATORY_CARE_PROVIDER_SITE_OTHER): Payer: Commercial Managed Care - PPO | Admitting: Surgery

## 2013-10-28 ENCOUNTER — Encounter (INDEPENDENT_AMBULATORY_CARE_PROVIDER_SITE_OTHER): Payer: Self-pay | Admitting: Surgery

## 2013-10-28 VITALS — BP 138/82 | HR 68 | Temp 98.4°F | Resp 20 | Ht 65.0 in | Wt 249.0 lb

## 2013-10-28 DIAGNOSIS — K432 Incisional hernia without obstruction or gangrene: Secondary | ICD-10-CM

## 2013-10-28 NOTE — Progress Notes (Signed)
General Surgery [facility]  Chief Complaint  Patient presents with  . Follow-up    recheck umbilical wound/ pt c/o bulge - lap chole 05/09/2013    HISTORY: Patient is a 53 year old female who underwent urgent cholecystectomy in July 2014 for acute cholecystitis. Postoperatively her recovery was uneventful. Approximately 2 months ago she noted a bulge just above the level of the umbilicus. This has increased in size. It causes minimal discomfort. She presents today for evaluation.  Past Medical History  Diagnosis Date  . GERD (gastroesophageal reflux disease)   . Hepatitis C   . Hypertension   . Hyperlipidemia   . PPD positive   . Bowel habit changes   . Acute gangrenous cholecystitis 05/11/2013  . GERD (gastroesophageal reflux disease) 05/11/2013  . Other and unspecified hyperlipidemia 05/11/2013  . Tobacco use disorder 05/11/2013  . Obesity (BMI 35.0-39.9 without comorbidity) 05/11/2013  . Chronic back pain 05/11/2013  . Cholelithiasis without obstruction 05/11/2013    Current Outpatient Prescriptions  Medication Sig Dispense Refill  . acetaminophen (TYLENOL) 325 MG tablet Take 2 tablets (650 mg total) by mouth every 4 (four) hours as needed.      . ALPRAZolam (XANAX) 0.5 MG tablet Take 0.5 mg by mouth at bedtime as needed for sleep or anxiety.      Marland Kitchen amoxicillin-clavulanate (AUGMENTIN) 875-125 MG per tablet Take 1 tablet by mouth every 12 (twelve) hours.  10 tablet  0  . buPROPion (WELLBUTRIN SR) 150 MG 12 hr tablet Take 1 tablet (150 mg total) by mouth daily.  90 tablet  3  . Calcitriol (VECTICAL) 3 MCG/GM cream Apply 1 application topically as needed (skin irritation).       . clobetasol cream (TEMOVATE) 4.09 % Apply 1 application topically as needed (skin irritation).  30 g  0  . cyclobenzaprine (FLEXERIL) 10 MG tablet Take 10 mg by mouth as needed for muscle spasms.       . ergocalciferol (VITAMIN D2) 50000 UNITS capsule Take 1 capsule (50,000 Units total) by  mouth once a week.  4 capsule  12  . meloxicam (MOBIC) 15 MG tablet Take 15 mg by mouth daily.      . ondansetron (ZOFRAN) 4 MG tablet Take 1 tablet (4 mg total) by mouth every 8 (eight) hours as needed for nausea.  20 tablet  0  . pantoprazole (PROTONIX) 40 MG tablet TAKE 1 TABLET BY MOUTH 2 TIMES DAILY.  180 tablet  0  . permethrin (ELIMITE) 1 % lotion SHAMPOO, RINSE AND TOWEL DRY, SATURATE HAIR AND SCALP WITH PERMETHRIN, RINSE AFTER 10 MINUTES, REPEAT IN 1 WEEK IF NEEDED  120 mL  PRN  . simvastatin (ZOCOR) 40 MG tablet TAKE 1 TABLET BY MOUTH DAILY.  90 tablet  0  . traMADol (ULTRAM) 50 MG tablet Take 50 mg by mouth every 6 (six) hours as needed for pain.       Marland Kitchen gabapentin (NEURONTIN) 100 MG capsule Take 100 mg by mouth 2 (two) times daily.      Marland Kitchen triamterene-hydrochlorothiazide (DYAZIDE) 37.5-25 MG per capsule TAKE 1 CAPSULE BY MOUTH EVERY MORNING.  90 capsule  0   No current facility-administered medications for this visit.    No Known Allergies  Family History  Problem Relation Age of Onset  . Cancer Mother     Colon Cancer  . Hyperlipidemia Mother   . Hypertension Mother   . Colon cancer Mother   . Hypertension Father   . Diabetes Father   .  Diabetes Sister   . Diabetes Maternal Grandfather     History   Social History  . Marital Status: Divorced    Spouse Name: N/A    Number of Children: 3  . Years of Education: 12+   Occupational History  . 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 Smoking status: Current Every Day Smoker -- 0.21 packs/day    Types: Cigarettes    Start date: 05/08/2008  . Smokeless tobacco: Never Used     Comment: Smokes 4 cigarettes a day.  . Alcohol Use: 2.2 oz/week    1 Glasses of wine, 1 Cans of beer, 2 Drinks containing 0.5 oz of alcohol per week     Comment: increased 4 drinks a week for one year  . Drug Use: No  . Sexual Activity: None   Other Topics Concern  . None   Social History Narrative   Regular  exercise-no   Caffeine Use-yes    REVIEW OF SYSTEMS - PERTINENT POSITIVES ONLY: Denies signs or symptoms about structuring. Minor discomfort. Bulge at level of umbilicus increasing in size.  EXAM: Filed Vitals:   10/28/13 1125  BP: 138/82  Pulse: 68  Temp: 98.4 F (36.9 C)  Resp: 20    GENERAL: well-developed, well-nourished, no acute distress HEENT: normocephalic; pupils equal and reactive; sclerae clear; dentition good; mucous membranes moist NECK:  symmetric on extension; no palpable anterior or posterior cervical lymphadenopathy; no supraclavicular masses; no tenderness CHEST: clear to auscultation bilaterally without rales, rhonchi, or wheezes CARDIAC: regular rate and rhythm without significant murmur; peripheral pulses are full ABDOMEN: soft without distension; bowel sounds present; no mass; no hepatosplenomegaly; incisions are well-healed; palpation above the level of the umbilicus shows a palpable mass in the subcutaneous tissues measuring approximately 6 cm in diameter; with manipulation, this is reducible. The fascial defect measures approximately 2 cm in diameter. With Valsalva and cough the hernia prolapses easily. EXT:  non-tender without edema; no deformity NEURO: no gross focal deficits; no sign of tremor   LABORATORY RESULTS: See [facility] (CHL-Epic) for most recent results  RADIOLOGY RESULTS: See [facility] (CHL-Epic) for most recent results  IMPRESSION: Incisional hernia, reducible  PLAN: Patient and I discussed these findings. She has an incisional hernia. This is not surprising given her previous multiple procedures and associated infections at the time of surgery.  Patient and I discussed repair of ventral incisional hernia with mesh. I provided her with written literature to review. We discussed open versus laparoscopic technique. She prefers laparoscopic technique and I am in agreement provided there is not an inordinate amount of adhesions  present at the time of surgery. We discussed repair using prosthetic mesh. We discussed the potential for conversion to an open procedure. We discussed the hospital stay to be anticipated. We discussed restrictions on her activities following surgery and the use of an abdominal binder. I told her to plan on approximately 3 weeks out of work. Patient understands would like to proceed with surgery in the near future. We will make arrangements for her procedure.  The risks and benefits of the procedure have been discussed at length with the patient.  The patient understands the proposed procedure, potential alternative treatments, and the course of recovery to be expected.  All of the patient's questions have been answered at this time.  The patient wishes to proceed with surgery.  Earnstine Regal, MD, [facility]  Primary Care Physician:  Webb Silversmith, NP

## 2013-10-28 NOTE — Patient Instructions (Signed)
[facility]  HERNIA REPAIR POST OP INSTRUCTIONS  Always review your discharge instruction sheet given to you by the facility where your surgery was performed.  1. A  prescription for pain medication may be given to you upon discharge.  Take your pain medication as prescribed.  If narcotic pain medicine is not needed, then you may take acetaminophen (Tylenol) or ibuprofen (Advil) as needed.  2. Take your usually prescribed medications unless otherwise directed.  3. If you need a refill on your pain medication, please contact your pharmacy.  They will contact our office to request authorization. Prescriptions will not be filled after 5 pm daily or on weekends.  4. You should follow a light diet the first 24 hours after arrival home, such as soup and crackers or toast.  Be sure to include plenty of fluids daily.  Resume your normal diet the day after surgery.  5. Most patients will experience some swelling and bruising around the surgical site.  Ice packs and reclining will help.  Swelling and bruising can take several days to resolve.   6. It is common to experience some constipation if taking pain medication after surgery.  Increasing fluid intake and taking a stool softener (such as Colace) will usually help or prevent this problem from occurring.  A mild laxative (Milk of Magnesia or Miralax) should be taken according to package directions if there are no bowel movements after 48 hours.  7. Unless discharge instructions indicate otherwise, you may remove your bandages 24-48 hours after surgery, and you may shower at that time.  You may have steri-strips (small skin tapes) in place directly over the incision.  These strips should be left on the skin for 7-10 days.  If your surgeon used skin glue on the incision, you may shower in 24 hours.  The glue will flake off over the next 2-3 weeks.  Any sutures or staples will be removed at the office during your follow-up  visit.  8. ACTIVITIES:  You may resume regular (light) daily activities beginning the next day-such as daily self-care, walking, climbing stairs-gradually increasing activities as tolerated.  You may have sexual intercourse when it is comfortable.  Refrain from any heavy lifting or straining until approved by your doctor.  You may drive when you are no longer taking prescription pain medication, you can comfortably wear a seatbelt, and you can safely maneuver your car and apply brakes.  9. You should see your doctor in the office for a follow-up appointment approximately 2-3 weeks after your surgery.  Make sure that you call for this appointment within a day or two after you arrive home to insure a convenient appointment time. 10.   WHEN TO CALL YOUR DOCTOR: 1. Fever greater than 101.0 2. Inability to urinate 3. Persistent nausea and/or vomiting 4. Extreme swelling or bruising 5. Continued bleeding from incision 6. Increased pain, redness, or drainage from the incision  The clinic staff is available to answer your questions during regular business hours.  Please don't hesitate to call and ask to speak to one of the nurses for clinical concerns.  If you have a medical emergency, go to the nearest emergency room or call 911.  A surgeon from [facility] is always on call for the hospital.   [facility] [address], [address], [place], [place]  [address]  [phone] ? [phone] ? FAX [phone]  [email or web address]   

## 2013-11-24 ENCOUNTER — Encounter (HOSPITAL_COMMUNITY): Payer: Self-pay | Admitting: Pharmacy Technician

## 2013-11-25 ENCOUNTER — Encounter (HOSPITAL_COMMUNITY): Payer: Self-pay

## 2013-11-25 ENCOUNTER — Ambulatory Visit (HOSPITAL_COMMUNITY)
Admission: RE | Admit: 2013-11-25 | Discharge: 2013-11-25 | Disposition: A | Discharge status: Home / Self Care | Payer: 59 | Source: Ambulatory Visit | Attending: Surgery | Admitting: Surgery

## 2013-11-25 ENCOUNTER — Encounter (HOSPITAL_COMMUNITY)
Admission: RE | Admit: 2013-11-25 | Discharge: 2013-11-25 | Disposition: A | Discharge status: Home / Self Care | Payer: 59 | Source: Ambulatory Visit | Attending: Surgery | Admitting: Surgery

## 2013-11-25 ENCOUNTER — Other Ambulatory Visit (HOSPITAL_COMMUNITY): Payer: Self-pay | Admitting: Surgery

## 2013-11-25 DIAGNOSIS — Z01810 Encounter for preprocedural cardiovascular examination: Secondary | ICD-10-CM | POA: Insufficient documentation

## 2013-11-25 DIAGNOSIS — I10 Essential (primary) hypertension: Secondary | ICD-10-CM | POA: Insufficient documentation

## 2013-11-25 DIAGNOSIS — R9431 Abnormal electrocardiogram [ECG] [EKG]: Secondary | ICD-10-CM | POA: Insufficient documentation

## 2013-11-25 DIAGNOSIS — Z01812 Encounter for preprocedural laboratory examination: Secondary | ICD-10-CM | POA: Insufficient documentation

## 2013-11-25 DIAGNOSIS — K432 Incisional hernia without obstruction or gangrene: Secondary | ICD-10-CM | POA: Insufficient documentation

## 2013-11-25 HISTORY — DX: Low back pain, unspecified: M54.50

## 2013-11-25 HISTORY — DX: Prediabetes: R73.03

## 2013-11-25 HISTORY — DX: Unspecified osteoarthritis, unspecified site: M19.90

## 2013-11-25 HISTORY — DX: Low back pain: M54.5

## 2013-11-25 LAB — COMPREHENSIVE METABOLIC PANEL
ALK PHOS: 55 U/L (ref 39–117)
ALT: 13 U/L (ref 0–35)
AST: 17 U/L (ref 0–37)
Albumin: 3.5 g/dL (ref 3.5–5.2)
BILIRUBIN TOTAL: 0.4 mg/dL (ref 0.3–1.2)
BUN: 15 mg/dL (ref 6–23)
CHLORIDE: 100 meq/L (ref 96–112)
CO2: 27 mEq/L (ref 19–32)
CREATININE: 0.78 mg/dL (ref 0.50–1.10)
Calcium: 8.9 mg/dL (ref 8.4–10.5)
GLUCOSE: 152 mg/dL — AB (ref 70–99)
Potassium: 3.6 mEq/L — ABNORMAL LOW (ref 3.7–5.3)
Sodium: 139 mEq/L (ref 137–147)
Total Protein: 7.1 g/dL (ref 6.0–8.3)

## 2013-11-25 LAB — CBC
HEMATOCRIT: 37.5 % (ref 36.0–46.0)
HEMOGLOBIN: 12.5 g/dL (ref 12.0–15.0)
MCH: 29.5 pg (ref 26.0–34.0)
MCHC: 33.3 g/dL (ref 30.0–36.0)
MCV: 88.4 fL (ref 78.0–100.0)
Platelets: 204 10*3/uL (ref 150–400)
RBC: 4.24 MIL/uL (ref 3.87–5.11)
RDW: 13.9 % (ref 11.5–15.5)
WBC: 4.4 10*3/uL (ref 4.0–10.5)

## 2013-11-25 LAB — HCG, SERUM, QUALITATIVE: PREG SERUM: NEGATIVE

## 2013-11-25 NOTE — Patient Instructions (Addendum)
      Your procedure is scheduled on:  2 19 14   THURSDAY  Report to [facility] at     [place].  Call this number if you have problems the morning of surgery: [phone]         Do not eat food  Or drink :After Midnight. Wednesday NIGHT   Take these medicines the morning of surgery with A SIP OF WATER: Bupropion, Pantaprazole May take Gabapentin or  Flexaril  if needed   .  Contacts, dentures or partial plates, or metal hairpins  can not be worn to surgery. Your family will be responsible for glasses, dentures, hearing aides while you are in surgery  Leave suitcase in the car. After surgery it may be brought to your room.  For patients admitted to the hospital, checkout time is 11:00 AM day of  discharge.                DO NOT WEAR JEWELRY, LOTIONS, POWDERS, OR PERFUMES.  WOMEN-- DO NOT SHAVE LEGS OR UNDERARMS FOR 48 HOURS BEFORE SHOWERS. MEN MAY SHAVE FACE.  Patients discharged the day of surgery will not be allowed to drive home. IF going home the day of surgery, you must have a driver and someone to stay with you for the first 24 hours  Name and phone number of your driver:    Daughter  Francina Ames  PST [phone]  [phone]                 Annetta North                                                  Patient Signature _____________________________

## 2013-11-25 NOTE — Progress Notes (Signed)
Instructed patient to call Dr Tera Helper office to see if needs to hold Meloxicam pre op- verbalized understanding

## 2013-11-26 NOTE — Progress Notes (Signed)
Quick Note:  These results are acceptable for scheduled surgery.  Arneta Mahmood M. Coleson Kant, MD, FACS [facility] Office: [phone]   ______ 

## 2013-12-03 ENCOUNTER — Ambulatory Visit (HOSPITAL_COMMUNITY): Payer: 59 | Admitting: Anesthesiology

## 2013-12-03 ENCOUNTER — Encounter (HOSPITAL_COMMUNITY): Payer: 59 | Admitting: Anesthesiology

## 2013-12-03 ENCOUNTER — Encounter (HOSPITAL_COMMUNITY): Payer: Self-pay

## 2013-12-03 ENCOUNTER — Observation Stay (HOSPITAL_COMMUNITY)
Admission: RE | Admit: 2013-12-03 | Discharge: 2013-12-05 | Disposition: A | Discharge status: Home / Self Care | Payer: 59 | Source: Ambulatory Visit | Attending: Surgery | Admitting: Surgery

## 2013-12-03 ENCOUNTER — Encounter (HOSPITAL_COMMUNITY)
Admission: RE | Disposition: A | Discharge status: Home / Self Care | Payer: Self-pay | Source: Ambulatory Visit | Attending: Surgery

## 2013-12-03 DIAGNOSIS — R7611 Nonspecific reaction to tuberculin skin test without active tuberculosis: Secondary | ICD-10-CM | POA: Insufficient documentation

## 2013-12-03 DIAGNOSIS — Z9089 Acquired absence of other organs: Secondary | ICD-10-CM | POA: Insufficient documentation

## 2013-12-03 DIAGNOSIS — K219 Gastro-esophageal reflux disease without esophagitis: Secondary | ICD-10-CM | POA: Insufficient documentation

## 2013-12-03 DIAGNOSIS — E785 Hyperlipidemia, unspecified: Secondary | ICD-10-CM | POA: Insufficient documentation

## 2013-12-03 DIAGNOSIS — K432 Incisional hernia without obstruction or gangrene: Principal | ICD-10-CM | POA: Diagnosis present

## 2013-12-03 DIAGNOSIS — B1920 Unspecified viral hepatitis C without hepatic coma: Secondary | ICD-10-CM | POA: Insufficient documentation

## 2013-12-03 DIAGNOSIS — E669 Obesity, unspecified: Secondary | ICD-10-CM | POA: Insufficient documentation

## 2013-12-03 DIAGNOSIS — Z79899 Other long term (current) drug therapy: Secondary | ICD-10-CM | POA: Insufficient documentation

## 2013-12-03 DIAGNOSIS — Z9851 Tubal ligation status: Secondary | ICD-10-CM | POA: Insufficient documentation

## 2013-12-03 DIAGNOSIS — Z6835 Body mass index (BMI) 35.0-35.9, adult: Secondary | ICD-10-CM | POA: Insufficient documentation

## 2013-12-03 DIAGNOSIS — Z5331 Laparoscopic surgical procedure converted to open procedure: Secondary | ICD-10-CM

## 2013-12-03 DIAGNOSIS — R110 Nausea: Secondary | ICD-10-CM | POA: Insufficient documentation

## 2013-12-03 DIAGNOSIS — I10 Essential (primary) hypertension: Secondary | ICD-10-CM | POA: Insufficient documentation

## 2013-12-03 DIAGNOSIS — F17200 Nicotine dependence, unspecified, uncomplicated: Secondary | ICD-10-CM | POA: Insufficient documentation

## 2013-12-03 HISTORY — PX: VENTRAL HERNIA REPAIR: SHX424

## 2013-12-03 HISTORY — PX: INSERTION OF MESH: SHX5868

## 2013-12-03 LAB — CBC
HEMATOCRIT: 37.6 % (ref 36.0–46.0)
HEMOGLOBIN: 12.3 g/dL (ref 12.0–15.0)
MCH: 29.1 pg (ref 26.0–34.0)
MCHC: 32.7 g/dL (ref 30.0–36.0)
MCV: 88.9 fL (ref 78.0–100.0)
Platelets: 185 10*3/uL (ref 150–400)
RBC: 4.23 MIL/uL (ref 3.87–5.11)
RDW: 14.3 % (ref 11.5–15.5)
WBC: 7.4 10*3/uL (ref 4.0–10.5)

## 2013-12-03 LAB — CREATININE, SERUM
CREATININE: 0.69 mg/dL (ref 0.50–1.10)
GFR calc Af Amer: >90 mL/min (ref >90)

## 2013-12-03 SURGERY — INSERTION OF MESH
Anesthesia: General

## 2013-12-03 MED ORDER — ONDANSETRON HCL 4 MG/2ML IJ SOLN
INTRAMUSCULAR | Status: DC | PRN
  Administered 2013-12-03: 4 mg via INTRAVENOUS

## 2013-12-03 MED ORDER — HYDROMORPHONE HCL PF 1 MG/ML IJ SOLN
INTRAMUSCULAR | Status: AC
  Filled 2013-12-03: qty 1

## 2013-12-03 MED ORDER — TRIAMTERENE-HCTZ 37.5-25 MG PO TABS
1.0000 | ORAL_TABLET | Freq: Every day | ORAL | Status: DC
  Administered 2013-12-03 – 2013-12-05 (×3): 1 via ORAL
  Filled 2013-12-03 (×3): qty 1

## 2013-12-03 MED ORDER — ENOXAPARIN SODIUM 40 MG/0.4ML ~~LOC~~ SOLN
40.0000 mg | SUBCUTANEOUS | Status: DC
  Administered 2013-12-04 – 2013-12-05 (×2): 40 mg via SUBCUTANEOUS
  Filled 2013-12-03 (×2): qty 0.4

## 2013-12-03 MED ORDER — OXYCODONE HCL 5 MG/5ML PO SOLN
5.0000 mg | Freq: Once | ORAL | Status: DC | PRN
  Filled 2013-12-03: qty 5

## 2013-12-03 MED ORDER — CISATRACURIUM BESYLATE (PF) 10 MG/5ML IV SOLN
INTRAVENOUS | Status: DC | PRN
  Administered 2013-12-03: 6 mg via INTRAVENOUS
  Administered 2013-12-03: 3 mg via INTRAVENOUS
  Administered 2013-12-03: 4 mg via INTRAVENOUS

## 2013-12-03 MED ORDER — NEOSTIGMINE METHYLSULFATE 1 MG/ML IJ SOLN
INTRAMUSCULAR | Status: AC
  Filled 2013-12-03: qty 10

## 2013-12-03 MED ORDER — HYDROMORPHONE HCL PF 1 MG/ML IJ SOLN
0.2500 mg | INTRAMUSCULAR | Status: DC | PRN
  Administered 2013-12-03: 0.5 mg via INTRAVENOUS
  Administered 2013-12-03 (×2): 0.25 mg via INTRAVENOUS

## 2013-12-03 MED ORDER — GABAPENTIN 100 MG PO CAPS
100.0000 mg | ORAL_CAPSULE | Freq: Three times a day (TID) | ORAL | Status: DC | PRN
  Filled 2013-12-03: qty 1

## 2013-12-03 MED ORDER — LACTATED RINGERS IV SOLN
INTRAVENOUS | Status: DC
  Administered 2013-12-03: 10:00:00 via INTRAVENOUS
  Administered 2013-12-03: 1000 mL via INTRAVENOUS

## 2013-12-03 MED ORDER — KCL IN DEXTROSE-NACL 20-5-0.45 MEQ/L-%-% IV SOLN
INTRAVENOUS | Status: DC
  Administered 2013-12-03 – 2013-12-04 (×2): via INTRAVENOUS
  Filled 2013-12-03 (×3): qty 1000

## 2013-12-03 MED ORDER — CEFAZOLIN SODIUM-DEXTROSE 2-3 GM-% IV SOLR
2.0000 g | INTRAVENOUS | Status: AC
  Administered 2013-12-03: 2 g via INTRAVENOUS

## 2013-12-03 MED ORDER — SUFENTANIL CITRATE 50 MCG/ML IV SOLN
INTRAVENOUS | Status: AC
  Filled 2013-12-03: qty 1

## 2013-12-03 MED ORDER — OXYCODONE HCL 5 MG PO TABS: 5.0000 mg | ORAL_TABLET | Freq: Once | ORAL | Status: DC | PRN

## 2013-12-03 MED ORDER — LIDOCAINE HCL (CARDIAC) 20 MG/ML IV SOLN
INTRAVENOUS | Status: AC
  Filled 2013-12-03: qty 5

## 2013-12-03 MED ORDER — GLYCOPYRROLATE 0.2 MG/ML IJ SOLN
INTRAMUSCULAR | Status: DC | PRN
  Administered 2013-12-03: 0.6 mg via INTRAVENOUS

## 2013-12-03 MED ORDER — MIDAZOLAM HCL 5 MG/5ML IJ SOLN
INTRAMUSCULAR | Status: DC | PRN
  Administered 2013-12-03: 2 mg via INTRAVENOUS

## 2013-12-03 MED ORDER — BUPROPION HCL ER (SR) 150 MG PO TB12
150.0000 mg | ORAL_TABLET | Freq: Every morning | ORAL | Status: DC
  Administered 2013-12-03 – 2013-12-05 (×3): 150 mg via ORAL
  Filled 2013-12-03 (×3): qty 1

## 2013-12-03 MED ORDER — LACTATED RINGERS IV SOLN
INTRAVENOUS | Status: DC | PRN
  Administered 2013-12-03 (×2): via INTRAVENOUS

## 2013-12-03 MED ORDER — PROPOFOL 10 MG/ML IV BOLUS
INTRAVENOUS | Status: AC
  Filled 2013-12-03: qty 20

## 2013-12-03 MED ORDER — ONDANSETRON HCL 4 MG PO TABS: 4.0000 mg | ORAL_TABLET | Freq: Four times a day (QID) | ORAL | Status: DC | PRN

## 2013-12-03 MED ORDER — EPHEDRINE SULFATE 50 MG/ML IJ SOLN
INTRAMUSCULAR | Status: AC
  Filled 2013-12-03: qty 1

## 2013-12-03 MED ORDER — MIDAZOLAM HCL 2 MG/2ML IJ SOLN
INTRAMUSCULAR | Status: AC
  Filled 2013-12-03: qty 2

## 2013-12-03 MED ORDER — PANTOPRAZOLE SODIUM 40 MG PO TBEC
40.0000 mg | DELAYED_RELEASE_TABLET | Freq: Two times a day (BID) | ORAL | Status: DC
  Administered 2013-12-03 – 2013-12-05 (×5): 40 mg via ORAL
  Filled 2013-12-03 (×6): qty 1

## 2013-12-03 MED ORDER — GLYCOPYRROLATE 0.2 MG/ML IJ SOLN
INTRAMUSCULAR | Status: AC
Start: 2013-12-03 — End: 2013-12-03
  Filled 2013-12-03: qty 3

## 2013-12-03 MED ORDER — LIDOCAINE HCL (CARDIAC) 20 MG/ML IV SOLN
INTRAVENOUS | Status: DC | PRN
  Administered 2013-12-03: 100 mg via INTRAVENOUS

## 2013-12-03 MED ORDER — DEXAMETHASONE SODIUM PHOSPHATE 10 MG/ML IJ SOLN
INTRAMUSCULAR | Status: AC
  Filled 2013-12-03: qty 1

## 2013-12-03 MED ORDER — PROPOFOL 10 MG/ML IV BOLUS
INTRAVENOUS | Status: DC | PRN
  Administered 2013-12-03: 200 mg via INTRAVENOUS

## 2013-12-03 MED ORDER — NEOSTIGMINE METHYLSULFATE 1 MG/ML IJ SOLN
INTRAMUSCULAR | Status: DC | PRN
  Administered 2013-12-03: 4 mg via INTRAVENOUS

## 2013-12-03 MED ORDER — ONDANSETRON HCL 4 MG/2ML IJ SOLN
INTRAMUSCULAR | Status: AC
  Filled 2013-12-03: qty 2

## 2013-12-03 MED ORDER — BUPIVACAINE-EPINEPHRINE 0.25% -1:200000 IJ SOLN
INTRAMUSCULAR | Status: DC | PRN
  Administered 2013-12-03: 30 mL

## 2013-12-03 MED ORDER — HYDROMORPHONE HCL PF 1 MG/ML IJ SOLN
1.0000 mg | INTRAMUSCULAR | Status: DC | PRN
  Administered 2013-12-03 – 2013-12-04 (×4): 1 mg via INTRAVENOUS
  Filled 2013-12-03 (×4): qty 1

## 2013-12-03 MED ORDER — HYDROMORPHONE HCL PF 1 MG/ML IJ SOLN
INTRAMUSCULAR | Status: DC | PRN
  Administered 2013-12-03 (×3): .4 mg via INTRAVENOUS

## 2013-12-03 MED ORDER — MELOXICAM 15 MG PO TABS
15.0000 mg | ORAL_TABLET | Freq: Every day | ORAL | Status: DC
  Administered 2013-12-03 – 2013-12-05 (×3): 15 mg via ORAL
  Filled 2013-12-03 (×3): qty 1

## 2013-12-03 MED ORDER — SUFENTANIL CITRATE 50 MCG/ML IV SOLN
INTRAVENOUS | Status: DC | PRN
  Administered 2013-12-03 (×2): 10 ug via INTRAVENOUS
  Administered 2013-12-03: 20 ug via INTRAVENOUS
  Administered 2013-12-03: 10 ug via INTRAVENOUS

## 2013-12-03 MED ORDER — MEPERIDINE HCL 50 MG/ML IJ SOLN: 6.2500 mg | INTRAMUSCULAR | Status: DC | PRN

## 2013-12-03 MED ORDER — HYDROMORPHONE HCL PF 2 MG/ML IJ SOLN
INTRAMUSCULAR | Status: AC
  Filled 2013-12-03: qty 1

## 2013-12-03 MED ORDER — LACTATED RINGERS IR SOLN
Status: DC | PRN
  Administered 2013-12-03: 1000 mL

## 2013-12-03 MED ORDER — ALPRAZOLAM 0.5 MG PO TABS
0.5000 mg | ORAL_TABLET | Freq: Every evening | ORAL | Status: DC | PRN
  Administered 2013-12-04: 0.5 mg via ORAL
  Filled 2013-12-03: qty 1

## 2013-12-03 MED ORDER — CEFAZOLIN SODIUM-DEXTROSE 2-3 GM-% IV SOLR
INTRAVENOUS | Status: AC
  Filled 2013-12-03: qty 50

## 2013-12-03 MED ORDER — 0.9 % SODIUM CHLORIDE (POUR BTL) OPTIME
TOPICAL | Status: DC | PRN
  Administered 2013-12-03: 1000 mL

## 2013-12-03 MED ORDER — HYDROCODONE-ACETAMINOPHEN 5-325 MG PO TABS
1.0000 | ORAL_TABLET | ORAL | Status: DC | PRN
  Administered 2013-12-04 – 2013-12-05 (×7): 2 via ORAL
  Filled 2013-12-03 (×7): qty 2

## 2013-12-03 MED ORDER — ONDANSETRON HCL 4 MG/2ML IJ SOLN
4.0000 mg | Freq: Four times a day (QID) | INTRAMUSCULAR | Status: DC | PRN
Start: 2013-12-03 — End: 2013-12-05
  Administered 2013-12-03 – 2013-12-04 (×2): 4 mg via INTRAVENOUS
  Filled 2013-12-03 (×2): qty 2

## 2013-12-03 MED ORDER — SODIUM CHLORIDE 0.9 % IJ SOLN
INTRAMUSCULAR | Status: AC
  Filled 2013-12-03: qty 10

## 2013-12-03 MED ORDER — TRIAMTERENE-HCTZ 37.5-25 MG PO CAPS
1.0000 | ORAL_CAPSULE | Freq: Every morning | ORAL | Status: DC
  Filled 2013-12-03: qty 1

## 2013-12-03 MED ORDER — SUCCINYLCHOLINE CHLORIDE 20 MG/ML IJ SOLN
INTRAMUSCULAR | Status: DC | PRN
  Administered 2013-12-03: 100 mg via INTRAVENOUS

## 2013-12-03 MED ORDER — BUPIVACAINE-EPINEPHRINE PF 0.25-1:200000 % IJ SOLN
INTRAMUSCULAR | Status: AC
  Filled 2013-12-03: qty 30

## 2013-12-03 MED ORDER — DEXAMETHASONE SODIUM PHOSPHATE 10 MG/ML IJ SOLN
INTRAMUSCULAR | Status: DC | PRN
  Administered 2013-12-03: 10 mg via INTRAVENOUS

## 2013-12-03 MED ORDER — ACETAMINOPHEN 325 MG PO TABS: 650.0000 mg | ORAL_TABLET | ORAL | Status: DC | PRN

## 2013-12-03 MED ORDER — PROMETHAZINE HCL 25 MG/ML IJ SOLN: 6.2500 mg | INTRAMUSCULAR | Status: DC | PRN

## 2013-12-03 MED ORDER — CISATRACURIUM BESYLATE 20 MG/10ML IV SOLN
INTRAVENOUS | Status: AC
  Filled 2013-12-03: qty 10

## 2013-12-03 SURGICAL SUPPLY — 48 items
BENZOIN TINCTURE PRP APPL 2/3 (GAUZE/BANDAGES/DRESSINGS) ×3 IMPLANT
BINDER ABD UNIV 12 45-62 (WOUND CARE) ×1 IMPLANT
BINDER ABDOMINAL 12 ML 46-62 (SOFTGOODS) IMPLANT
BINDER ABDOMINAL 46IN 62IN (WOUND CARE) ×3
CANISTER SUCTION 2500CC (MISCELLANEOUS) ×3 IMPLANT
CHLORAPREP W/TINT 26ML (MISCELLANEOUS) ×3 IMPLANT
CLOSURE WOUND 1/2 X4 (GAUZE/BANDAGES/DRESSINGS) ×1
DECANTER SPIKE VIAL GLASS SM (MISCELLANEOUS) ×3 IMPLANT
DEVICE SECURE STRAP 25 ABSORB (INSTRUMENTS) IMPLANT
DEVICE TROCAR PUNCTURE CLOSURE (ENDOMECHANICALS) ×3 IMPLANT
DRAIN CHANNEL 19F RND (DRAIN) ×3 IMPLANT
DRAPE INCISE IOBAN 66X45 STRL (DRAPES) IMPLANT
DRAPE LAPAROSCOPIC ABDOMINAL (DRAPES) ×3 IMPLANT
DRAPE UTILITY XL STRL (DRAPES) ×3 IMPLANT
ELECT CAUTERY BLADE 6.4 (BLADE) ×3 IMPLANT
ELECT REM PT RETURN 9FT ADLT (ELECTROSURGICAL) ×3
ELECTRODE REM PT RTRN 9FT ADLT (ELECTROSURGICAL) ×1 IMPLANT
EVACUATOR DRAINAGE 10X20 100CC (DRAIN) ×1 IMPLANT
EVACUATOR SILICONE 100CC (DRAIN) ×2
GLOVE SURG ORTHO 8.0 STRL STRW (GLOVE) ×3 IMPLANT
GOWN STRL REUS W/TWL XL LVL3 (GOWN DISPOSABLE) ×6 IMPLANT
KIT BASIN OR (CUSTOM PROCEDURE TRAY) ×3 IMPLANT
MARKER SKIN DUAL TIP RULER LAB (MISCELLANEOUS) ×3 IMPLANT
MESH HERNIA 6X6 BARD (Mesh General) ×1 IMPLANT
MESH HERNIA BARD 6X6 (Mesh General) ×2 IMPLANT
NEEDLE SPNL 22GX3.5 QUINCKE BK (NEEDLE) ×3 IMPLANT
PENCIL BUTTON HOLSTER BLD 10FT (ELECTRODE) ×3 IMPLANT
SCALPEL HARMONIC ACE (MISCELLANEOUS) IMPLANT
SCISSORS LAP 5X35 DISP (ENDOMECHANICALS) ×3 IMPLANT
SET IRRIG TUBING LAPAROSCOPIC (IRRIGATION / IRRIGATOR) IMPLANT
SOLUTION ANTI FOG 6CC (MISCELLANEOUS) ×3 IMPLANT
SPONGE DRAIN TRACH 4X4 STRL 2S (GAUZE/BANDAGES/DRESSINGS) ×3 IMPLANT
SPONGE GAUZE 4X4 12PLY (GAUZE/BANDAGES/DRESSINGS) ×3 IMPLANT
STAPLER VISISTAT 35W (STAPLE) ×3 IMPLANT
STRIP CLOSURE SKIN 1/2X4 (GAUZE/BANDAGES/DRESSINGS) ×2 IMPLANT
SUT NOVA 0 T19/GS 22DT (SUTURE) ×3 IMPLANT
SUT NOVA NAB DX-16 0-1 5-0 T12 (SUTURE) IMPLANT
SUT NOVA NAB GS-21 0 18 T12 DT (SUTURE) ×6 IMPLANT
SUT VIC AB 2-0 SH 18 (SUTURE) ×3 IMPLANT
SYR BULB IRRIGATION 50ML (SYRINGE) ×3 IMPLANT
TACKER 5MM HERNIA 3.5CML NAB (ENDOMECHANICALS) IMPLANT
TAPE CLOTH SURG 4X10 WHT LF (GAUZE/BANDAGES/DRESSINGS) ×3 IMPLANT
TOWEL OR 17X26 10 PK STRL BLUE (TOWEL DISPOSABLE) ×3 IMPLANT
TOWEL OR NON WOVEN STRL DISP B (DISPOSABLE) ×3 IMPLANT
TRAY FOLEY CATH 14FRSI W/METER (CATHETERS) ×3 IMPLANT
TRAY LAP CHOLE (CUSTOM PROCEDURE TRAY) ×3 IMPLANT
TUBING INSUFFLATION 10FT LAP (TUBING) ×3 IMPLANT
YANKAUER SUCT BULB TIP 10FT TU (MISCELLANEOUS) ×3 IMPLANT

## 2013-12-03 NOTE — Transfer of Care (Signed)
Immediate Anesthesia Transfer of Care Note  Patient: Jody [name]  Procedure(s) Performed: Procedure(s): INSERTION OF MESH (N/A) HERNIA REPAIR VENTRAL ADULT (N/A)  Patient Location: PACU  Anesthesia Type:General  Level of Consciousness: awake  Airway & Oxygen Therapy: Patient Spontanous Breathing and Patient connected to face mask oxygen  Post-op Assessment: Report given to PACU RN and Post -op Vital signs reviewed and stable  Post vital signs: Reviewed and stable  Complications: No apparent anesthesia complications

## 2013-12-03 NOTE — Anesthesia Postprocedure Evaluation (Signed)
Anesthesia Post Note  Patient: Jody [name]  Procedure(s) Performed: Procedure(s) (LRB): INSERTION OF MESH (N/A) HERNIA REPAIR VENTRAL ADULT (N/A)  Anesthesia type: General  Patient location: PACU  Post pain: Pain level controlled  Post assessment: Post-op Vital signs reviewed  Last Vitals: BP 138/59  Pulse 62  Temp(Src) 36.3 C (Oral)  Resp 18  SpO2 100%  Post vital signs: Reviewed  Level of consciousness: sedated  Complications: No apparent anesthesia complications

## 2013-12-03 NOTE — Anesthesia Procedure Notes (Signed)
Procedure Name: Intubation Date/Time: 12/03/2013 7:34 AM Performed by: Danley Danker L Patient Re-evaluated:Patient Re-evaluated prior to inductionOxygen Delivery Method: Circle system utilized Preoxygenation: Pre-oxygenation with 100% oxygen Intubation Type: IV induction Ventilation: Mask ventilation without difficulty and Oral airway inserted - appropriate to patient size Laryngoscope Size: Sabra Heck and 2 Grade View: Grade I Tube type: Oral Tube size: 7.5 mm Number of attempts: 1 Airway Equipment and Method: Stylet Placement Confirmation: ETT inserted through vocal cords under direct vision,  positive ETCO2 and breath sounds checked- equal and bilateral Secured at: 22 cm Tube secured with: Tape Dental Injury: Teeth and Oropharynx as per pre-operative assessment

## 2013-12-03 NOTE — H&P (Signed)
Jody [name] is an 53 y.o. female.    General Surgery [facility]  Chief Complaint: incisional hernia  HPI: Patient is a 54 year old female who underwent urgent cholecystectomy in July 2014 for acute cholecystitis. Postoperatively her recovery was uneventful. Approximately 2 months ago she noted a bulge just above the level of the umbilicus. This has increased in size. It causes minimal discomfort. She presents today for surgery.  Past Medical History  Diagnosis Date  . GERD (gastroesophageal reflux disease)   . Hepatitis C   . Hypertension   . Hyperlipidemia   . PPD positive   . Bowel habit changes   . Acute gangrenous cholecystitis 05/11/2013  . GERD (gastroesophageal reflux disease) 05/11/2013  . Other and unspecified hyperlipidemia 05/11/2013  . Tobacco use disorder 05/11/2013  . Obesity (BMI 35.0-39.9 without comorbidity) 05/11/2013  . Chronic back pain 05/11/2013  . Cholelithiasis without obstruction 05/11/2013  . Pre-diabetes     per pt  . Arthritis   . Lumbar back pain     with muscle spasms    Past Surgical History  Procedure Laterality Date  . Tonsillectomy  1978  . Tubal ligation  02/1987  . Appendectomy  1990  . Colonoscopy  2009, July 2014    found polyps 2014  . Umbilical surg      removed naval due to keloids/1995  . Morris    twins  . Cholecystectomy N/A 05/09/2013    Procedure: LAPAROSCOPIC CHOLECYSTECTOMY;  Surgeon: Earnstine Regal, MD;  Location: [facility];  Service: General;  Laterality: N/A;  . Cesarean section with bilateral tubal ligation      Family History  Problem Relation Age of Onset  . Cancer Mother     Colon Cancer  . Hyperlipidemia Mother   . Hypertension Mother   . Colon cancer Mother   . Hypertension Father   . Diabetes Father   . Diabetes Sister   . Diabetes Maternal Grandfather    Social History:  reports that she has been smoking Cigarettes.  She started smoking about 5 years ago. She has been smoking  about 0.21 packs per day. She has never used smokeless tobacco. She reports that she drinks about 2.2 ounces of alcohol per week. She reports that she does not use illicit drugs.  Allergies: No Known Allergies  Medications Prior to Admission  Medication Sig Dispense Refill  . baclofen (LIORESAL) 10 MG tablet Take 10 mg by mouth 3 (three) times daily as needed for muscle spasms.      Marland Kitchen buPROPion (WELLBUTRIN SR) 150 MG 12 hr tablet Take 150 mg by mouth every morning.      . gabapentin (NEURONTIN) 100 MG capsule Take 100 mg by mouth 3 (three) times daily as needed (pain).       . meloxicam (MOBIC) 15 MG tablet Take 15 mg by mouth daily.      . pantoprazole (PROTONIX) 40 MG tablet Take 40 mg by mouth 2 (two) times daily.      . simvastatin (ZOCOR) 40 MG tablet Take 40 mg by mouth every morning.      . triamterene-hydrochlorothiazide (DYAZIDE) 37.5-25 MG per capsule Take 1 capsule by mouth every morning.      Marland Kitchen ALPRAZolam (XANAX) 0.5 MG tablet Take 0.5 mg by mouth at bedtime as needed for sleep or anxiety.      . cyclobenzaprine (FLEXERIL) 10 MG tablet Take 10 mg by mouth as needed for muscle spasms.  No results found for this or any previous visit (from the past 48 hour(s)). No results found.  Review of Systems  Constitutional: Negative.   HENT: Negative.   Eyes: Negative.   Respiratory: Negative.   Cardiovascular: Negative.   Gastrointestinal: Negative.   Genitourinary: Negative.   Musculoskeletal:       Bulge at umbilicus  Skin: Negative.   Neurological: Negative.   Endo/Heme/Allergies: Negative.   Psychiatric/Behavioral: Negative.     Blood pressure 142/81, pulse 64, temperature 97.3 F (36.3 C), temperature source Oral, resp. rate 18, SpO2 100.00%. Physical Exam  Constitutional: She is oriented to person, place, and time. She appears well-developed and well-nourished. No distress.  HENT:  Head: Normocephalic and atraumatic.  Right Ear: External ear normal.  Left Ear:  External ear normal.  Eyes: Conjunctivae are normal. Pupils are equal, round, and reactive to light. No scleral icterus.  Neck: Normal range of motion. Neck supple. No thyromegaly present.  Cardiovascular: Normal rate, regular rhythm and normal heart sounds.   Respiratory: Effort normal and breath sounds normal.  GI: Soft. Bowel sounds are normal. She exhibits no distension.  Incisional hernia above level of umbilicus, reducible  Musculoskeletal: Normal range of motion. She exhibits no tenderness.  Lymphadenopathy:    She has no cervical adenopathy.  Neurological: She is alert and oriented to person, place, and time.  Skin: Skin is warm and dry.  Psychiatric: She has a normal mood and affect. Her behavior is normal.     Assessment/Plan Incisional hernia  Plan laparoscopic repair with mesh  The risks and benefits of the procedure have been discussed at length with the patient.  The patient understands the proposed procedure, potential alternative treatments, and the course of recovery to be expected.  All of the patient's questions have been answered at this time.  The patient wishes to proceed with surgery.  Earnstine Regal, MD, [facility] Office: [place] 12/03/2013, 7:13 AM

## 2013-12-03 NOTE — Brief Op Note (Signed)
12/03/2013  9:24 AM  PATIENT:  Jody [name]  53 y.o. female  PRE-OPERATIVE DIAGNOSIS:  ventral incisional hernia   POST-OPERATIVE DIAGNOSIS:  same  PROCEDURE:  Procedure(s): INSERTION OF MESH (N/A) HERNIA REPAIR VENTRAL ADULT (N/A)  SURGEON:  Surgeon(s) and Role:    * Earnstine Regal, MD - Primary  ANESTHESIA:   general  EBL:  Total I/O In: 1000 [I.V.:1000] Out: -   BLOOD ADMINISTERED:none  DRAINS: (19Fr) Blake drain(s) in the subcutaneous space   LOCAL MEDICATIONS USED:  NONE  SPECIMEN:  No Specimen  DISPOSITION OF SPECIMEN:  N/A  COUNTS:  YES  TOURNIQUET:  * No tourniquets in log *  DICTATION: .Other Dictation: Dictation Number [id number]  PLAN OF CARE: Admit for overnight observation  PATIENT DISPOSITION:  PACU - hemodynamically stable.   Delay start of Pharmacological VTE agent (>24hrs) due to surgical blood loss or risk of bleeding: yes  Earnstine Regal, MD, [facility] Office: [phone]

## 2013-12-03 NOTE — Preoperative (Signed)
Beta Blockers   Reason not to administer Beta Blockers:Not Applicable 

## 2013-12-03 NOTE — Anesthesia Preprocedure Evaluation (Signed)
Anesthesia Evaluation  Patient identified by MRN, date of birth, ID band Patient awake    Reviewed: Allergy & Precautions, H&P , NPO status , Patient's Chart, lab work & pertinent test results  Airway Mallampati: II TM Distance: >3 FB Neck ROM: full    Dental no notable dental hx. (+) Teeth Intact, Dental Advisory Given   Pulmonary neg pulmonary ROS, Current Smoker,  breath sounds clear to auscultation  Pulmonary exam normal       Cardiovascular Exercise Tolerance: Good hypertension, Pt. on medications Rhythm:regular Rate:Normal     Neuro/Psych negative neurological ROS  negative psych ROS   GI/Hepatic GERD-  Medicated and Controlled,(+) Hepatitis -, C  Endo/Other  Morbid obesity  Renal/GU negative Renal ROS     Musculoskeletal   Abdominal   Peds  Hematology negative hematology ROS (+)   Anesthesia Other Findings   Reproductive/Obstetrics negative OB ROS                           Anesthesia Physical  Anesthesia Plan  ASA: III  Anesthesia Plan: General   Post-op Pain Management:    Induction: Intravenous  Airway Management Planned: LMA  Additional Equipment:   Intra-op Plan:   Post-operative Plan: Extubation in OR  Informed Consent: I have reviewed the patients History and Physical, chart, labs and discussed the procedure including the risks, benefits and alternatives for the proposed anesthesia with the patient or authorized representative who has indicated his/her understanding and acceptance.   Dental Advisory Given  Plan Discussed with: CRNA  Anesthesia Plan Comments:         Anesthesia Quick Evaluation

## 2013-12-04 ENCOUNTER — Encounter (HOSPITAL_COMMUNITY): Payer: Self-pay | Admitting: Surgery

## 2013-12-04 MED ORDER — MORPHINE SULFATE 2 MG/ML IJ SOLN
2.0000 mg | INTRAMUSCULAR | Status: DC | PRN
  Administered 2013-12-04: 2 mg via INTRAVENOUS
  Filled 2013-12-04: qty 1

## 2013-12-04 NOTE — Progress Notes (Signed)
UR completed 

## 2013-12-04 NOTE — Progress Notes (Signed)
Pt's iv infiltrated.  Pt tolerating po's well, no c/o nausea; walking the halls frequently; not passing gas presently; rocking chair in room

## 2013-12-04 NOTE — Progress Notes (Signed)
Pt had 2 episodes of vomiting last night after dilaudid IV was administered. During one of these adminisrtrations, pt was medicated with zofran, which helped. Madelin Headings

## 2013-12-04 NOTE — Progress Notes (Signed)
Patient ID: SHALEIGH LAUBSCHER, female   DOB: 19-Sep-1961, 53 y.o.   MRN: [id number]  General Surgery - [facility] - Progress Note  POD# 1  Subjective: Patient with mild pain.  Some nausea with Dilaudid - requests morphine instead.  Tolerating clear liquids.  Ambulating in room.  Objective: Vital signs in last 24 hours: Temp:  [97.4 F (36.3 C)-99.1 F (37.3 C)] 97.6 F (36.4 C) (02/20 0530) Pulse Rate:  [56-78] 63 (02/20 0530) Resp:  [13-20] 18 (02/20 0530) BP: (124-157)/(59-108) 127/64 mmHg (02/20 0530) SpO2:  [93 %-100 %] 93 % (02/20 0530) Weight:  [247 lb (112.038 kg)] 247 lb (112.038 kg) (02/19 1240) Last BM Date: 12/02/13  Intake/Output from previous day: 02/19 0701 - 02/20 0700 In: 3520.3 [P.O.:600; I.V.:2920.3] Out: 3950 [Urine:3400; Emesis/NG output:400; Drains:100; Blood:50]  Exam: HEENT - clear, not icteric Neck - soft Chest - clear bilaterally Cor - RRR, no murmur Abd - soft, obese; dressings dry and intact; JP with serosanguinous Ext - no significant edema Neuro - grossly intact, no focal deficits  Lab Results:   Recent Labs  12/03/13 1330  WBC 7.4  HGB 12.3  HCT 37.6  PLT 185     Recent Labs  12/03/13 1330  CREATININE 0.69    Studies/Results: No results found.  Assessment / Plan: 1.  Status post ventral incisional hernia repair with mesh  Advance diet to regular  Change Dilaudid to Morphine prn  Ambulate in halls  Instruct in drain care  Likely home tomorrow  Earnstine Regal, MD, [facility] Office: [phone]  12/04/2013

## 2013-12-04 NOTE — Care Management Note (Signed)
    Page 1 of 1   12/04/2013     10:31:22 AM   CARE MANAGEMENT NOTE 12/04/2013  Patient:  Jody [name], Jody [name]   Account Number:  [id number]  Date Initiated:  12/04/2013  Documentation initiated by:  Sunday Spillers  Subjective/Objective Assessment:   53 yo female admitted s/p open hernia repair. PTA lived at home with daughter.     Action/Plan:   Home when stable   Anticipated DC Date:  12/05/2013   Anticipated DC Plan:  [place]  CM consult      Choice offered to / List presented to:             Status of service:  Completed, signed off Medicare Important Message given?   (If response is "NO", the following Medicare IM given date fields will be blank) Date Medicare IM given:   Date Additional Medicare IM given:    Discharge Disposition:  HOME/SELF CARE  Per UR Regulation:  Reviewed for med. necessity/level of care/duration of stay  If discussed at [place] of Stay Meetings, dates discussed:    Comments:

## 2013-12-04 NOTE — Op Note (Signed)
Jody [name], Jody [name] NO.:  [id number]  MEDICAL RECORD NO.:  [id number]  LOCATION:  4259                         FACILITY:  [facility]  PHYSICIAN:  Earnstine Regal, MD      DATE OF BIRTH:  02/02/1961  DATE OF PROCEDURE:  12/03/2013                              OPERATIVE REPORT   PREOPERATIVE DIAGNOSIS:  Ventral incisional hernia.  POSTOPERATIVE DIAGNOSIS:  Ventral incisional hernia.  PROCEDURE: 1. Attempted laparoscopic ventral incisional hernia repair with mesh. 2. Open ventral incisional hernia repair with mesh.  SURGEON:  Earnstine Regal, MD, FACS  ANESTHESIA:  General.  ESTIMATED BLOOD LOSS:  Minimal.  PREPARATION:  ChloraPrep.  COMPLICATIONS:  None.  INDICATIONS:  The patient is a 53 year old female who underwent cholecystectomy in July 2014 for gangrenous cholecystitis.  The patient did well initially.  Approximately 2 months ago, she noted mild discomfort at the level of the umbilicus and development of a bulge. She was evaluated by her physician and diagnosed with incisional hernia. The patient has been prepared and now brought to the operating room for repair.  BODY OF REPORT:  Procedures done in OR #6 at the [facility].  The patient was brought to the operating room, placed in supine position on the operating room table.  Following administration of general anesthesia, the patient was positioned and then prepped and draped in the usual aseptic fashion.  After ascertaining that an adequate level of anesthesia had been achieved, an incision was made at the lateral left costal margin.  Using a 5-mm Optiview trocar and the laparoscope, the trocar was advanced through the abdominal wall under direct vision into the peritoneal cavity.  Pneumoperitoneum was established.  An angled scope is selected and inserted into the peritoneal cavity.  There are extensive adhesions.  There are adhesions of the liver to the anterior abdominal wall.   There are adhesions of the greater omentum and gastrocolic ligament to the anterior abdominal wall. There appears to be colon in the hernia defect at the level of the umbilicus.  There are numerous loops of small bowel adhered to the lower midline incision.  A second operative port was placed under direct vision in the left lower quadrant.  Using blunt and sharp dissection with judicious use of the electrocautery, some of the adhesions in the left lateral abdomen were taken down.  However, there were multiple loops of bowel which were densely adherent to the anterior abdominal wall which will make complete lysis of adhesions difficult and lengthy in the duration of the procedure.  Therefore, decision was made to discontinue the laparoscopic approach and proceed with open repair of ventral incisional hernia as discussed preoperatively with the patient.  Pneumoperitoneum was released and ports were removed.  Using a #10 blade, a midline surgical incision was made at the site of previous port placement just above the umbilicus.  Dissection was carried into the subcutaneous tissue and hernia sac was identified.  Hernia sac was dissected out circumferentially, opened, and the contents were reduced back within the peritoneal cavity.  Adhesions were lysed with the Metzenbaum scissors.  Fascial defect was defined circumferentially. Fascial plane was then developed circumferentially for at least  5 cm in all directions.  Relaxing incisions were made bilaterally over the anterior rectus musculature.  This allows for closure of the midline fascial defect.  Sponges were counted and are correct.  Midline defect was then closed with interrupted #1 Novafil simple sutures.  Next, a sheet of polypropylene mesh was selected.  It measures 6 inches x 6 inches in size.  The corners were trimmed.  The mesh was inserted onto the anterior abdominal wall.  There was at least a 5 cm overlap in all directions of  the fascial closure.  Mesh was affixed to the abdominal wall with interrupted 0-Novafil simple sutures.  Good approximation of the mesh was achieved with broad overlap.  A 19-French Blake drain was brought in using the left lower quadrant incision after removal of the trocar.  The drain was secured to the skin with a 0- Novafil suture.  Drain was placed in the subcutaneous space immediately overlying the mesh.  Subcutaneous tissues were closed with interrupted 2- 0 Vicryl sutures.  Skin of all incisions was closed with stainless steel staples.  Drain was placed to bulb suction.  Sterile dressings were applied.  Abdominal binder was applied.  The patient was awakened from anesthesia and brought to the recovery room.  The patient tolerated the procedure well.   Earnstine Regal, MD, [facility] Office: [phone]    TMG/MEDQ  D:  12/03/2013  T:  12/04/2013  Job:  [id number]

## 2013-12-05 MED ORDER — HYDROCODONE-ACETAMINOPHEN 5-325 MG PO TABS: 1.0000 | ORAL_TABLET | ORAL | Status: DC | PRN

## 2013-12-05 NOTE — Discharge Summary (Signed)
Physician Discharge Summary  Patient ID: Jody [name] MRN: [id number] DOB/AGE: 1961-05-09 53 y.o.  Admit date: 12/03/2013 Discharge date: 12/05/2013  Admission Diagnoses:  Ventral hernia  Discharge Diagnoses:  same  Principal Problem:   Incisional hernia Active Problems:   Ventral incisional hernia   Surgery:  Ventral hernia repair  Discharged Condition: improved   Hospital Course:   Had surgery.  Kept for observation until passing flatus and tolerating diet.    Consults: none  Significant Diagnostic Studies: none     Discharge Exam: Blood pressure 148/76, pulse 62, temperature 97.8 F (36.6 C), temperature source Oral, resp. rate 18, height 5\' 5"  (1.651 m), weight 247 lb (112.038 kg), SpO2 96.00%. JP in place.  Incision OK  Disposition: 01-Home or Self Care  Discharge Orders   Future Orders Complete By Expires   Diet - low sodium heart healthy  As directed    Discharge wound care:  As directed    Comments:     Recharge JP drain as needed.  Dr. Harlow Asa will see you at end of week and take out.   Increase activity slowly  As directed        Medication List         ALPRAZolam 0.5 MG tablet  Commonly known as:  XANAX  Take 0.5 mg by mouth at bedtime as needed for sleep or anxiety.     baclofen 10 MG tablet  Commonly known as:  LIORESAL  Take 10 mg by mouth 3 (three) times daily as needed for muscle spasms.     buPROPion 150 MG 12 hr tablet  Commonly known as:  WELLBUTRIN SR  Take 150 mg by mouth every morning.     cyclobenzaprine 10 MG tablet  Commonly known as:  FLEXERIL  Take 10 mg by mouth as needed for muscle spasms.     gabapentin 100 MG capsule  Commonly known as:  NEURONTIN  Take 100 mg by mouth 3 (three) times daily as needed (pain).     HYDROcodone-acetaminophen 5-325 MG per tablet  Commonly known as:  NORCO/VICODIN  Take 1 tablet by mouth every 4 (four) hours as needed for moderate pain.     meloxicam 15 MG tablet  Commonly known as:   MOBIC  Take 15 mg by mouth daily.     pantoprazole 40 MG tablet  Commonly known as:  PROTONIX  Take 40 mg by mouth 2 (two) times daily.     simvastatin 40 MG tablet  Commonly known as:  ZOCOR  Take 40 mg by mouth every morning.     triamterene-hydrochlorothiazide 37.5-25 MG per capsule  Commonly known as:  DYAZIDE  Take 1 capsule by mouth every morning.           Follow-up Information   Follow up with Earnstine Regal, MD In 5 days. (Dr. Harlow Asa will remove drain at that time)    Specialty:  [name]   Contact information:   [address] [place] [address] [phone]       Signed: [name] 12/05/2013, 9:31 AM

## 2013-12-05 NOTE — Progress Notes (Signed)
Utilization Review completed.  

## 2013-12-05 NOTE — Plan of Care (Signed)
Problem: Discharge Progression Outcomes Goal: Tubes and drains discontinued if indicated Outcome: Not Met (add Reason) Pt to be discharged with JP tube.

## 2013-12-07 ENCOUNTER — Telehealth (INDEPENDENT_AMBULATORY_CARE_PROVIDER_SITE_OTHER): Payer: Self-pay

## 2013-12-07 NOTE — Telephone Encounter (Signed)
Patient states she is to call today to have her drain removed. She states the  Measurement this am  was 30cc then at two pm it was 30cc. Advised for her to call when the amount is less than 30cc for 3 days. We also need order to remove per DR. Gerkin

## 2013-12-09 ENCOUNTER — Ambulatory Visit (INDEPENDENT_AMBULATORY_CARE_PROVIDER_SITE_OTHER): Payer: Commercial Managed Care - PPO | Admitting: Surgery

## 2013-12-09 ENCOUNTER — Encounter (INDEPENDENT_AMBULATORY_CARE_PROVIDER_SITE_OTHER): Payer: Self-pay | Admitting: Surgery

## 2013-12-09 VITALS — BP 150/92 | HR 88 | Temp 98.8°F | Resp 15 | Ht 65.5 in | Wt 246.6 lb

## 2013-12-09 DIAGNOSIS — K432 Incisional hernia without obstruction or gangrene: Secondary | ICD-10-CM

## 2013-12-09 NOTE — Patient Instructions (Signed)
Continue drain care.  Continue to wear abdominal binder.  Earnstine Regal, MD, [facility] Office: [phone]

## 2013-12-09 NOTE — Progress Notes (Signed)
General Surgery [facility]  Chief Complaint  Patient presents with  . Routine Post Op    ventral incisional hernia repair with mesh 12/03/2013    HISTORY: Patient is a 53 year old female who underwent repair of ventral incisional hernia with mesh on 12/03/2013. She returns today for wound check and possible drain removal.  Drain output remains elevated at greater than 50 cc for each 24 hours.  EXAM: Surgical wounds are healing nicely. Skin incisions are clean. No sign of infection. Drain site is clean. Drain has serous sanguinous fluid.  IMPRESSION: Status post ventral incisional hernia repair with mesh  PLAN: Drain be left in place on suction. Patient will continue to wear an abdominal binder. She will return in 5 days for wound check and possible drain removal at that time.  Earnstine Regal, MD, [facility]   Visit Diagnoses: 1. Incisional hernia

## 2013-12-15 ENCOUNTER — Ambulatory Visit (INDEPENDENT_AMBULATORY_CARE_PROVIDER_SITE_OTHER): Payer: Commercial Managed Care - PPO

## 2013-12-15 DIAGNOSIS — Z4802 Encounter for removal of sutures: Secondary | ICD-10-CM

## 2013-12-15 DIAGNOSIS — Z4889 Encounter for other specified surgical aftercare: Secondary | ICD-10-CM

## 2013-12-15 DIAGNOSIS — Z4803 Encounter for change or removal of drains: Secondary | ICD-10-CM

## 2013-12-15 NOTE — Progress Notes (Signed)
Pt is s/p ventral hernia repair by Dr. Harlow Asa.  She is here today for staple removal and possible drain removal.  Pt is doing very well.  Drain was emptied last night at 5:30 pm.  At that time, it registered about 35 cc's of serous fluid.  Today it has not been emptied and registers approximately 25 cc's of fluid.  Dr. Harlow Asa paged and instructed me to remove the drain and every other staple.  Staples removed and steri strips placed. Drain was removed without difficulty and a gauze bandage placed over the opening.  Pt was instructed on showering and lifting restrictions.  She wants to drive, but I recommended she wait until she sees Dr. Harlow Asa next week and discuss it with him.  Pt agreed.  She was given her post op appointment.

## 2013-12-22 ENCOUNTER — Ambulatory Visit (INDEPENDENT_AMBULATORY_CARE_PROVIDER_SITE_OTHER): Payer: Commercial Managed Care - PPO | Admitting: Surgery

## 2013-12-22 ENCOUNTER — Encounter (INDEPENDENT_AMBULATORY_CARE_PROVIDER_SITE_OTHER): Payer: Self-pay | Admitting: Surgery

## 2013-12-22 ENCOUNTER — Encounter (INDEPENDENT_AMBULATORY_CARE_PROVIDER_SITE_OTHER): Payer: Self-pay

## 2013-12-22 VITALS — BP 140/94 | HR 84 | Temp 97.1°F | Resp 18 | Ht 65.5 in | Wt 253.0 lb

## 2013-12-22 DIAGNOSIS — K432 Incisional hernia without obstruction or gangrene: Secondary | ICD-10-CM

## 2013-12-22 NOTE — Patient Instructions (Signed)
  COCOA BUTTER & VITAMIN E CREAM  (Palmer's or other brand)  Apply cocoa butter/vitamin E cream to your incision 2 - 3 times daily.  Massage cream into incision for one minute with each application.  Use sunscreen (50 SPF or higher) for first 6 months after surgery if area is exposed to sun.  You may substitute Mederma or other scar reducing creams as desired.   

## 2013-12-22 NOTE — Progress Notes (Signed)
General Surgery [facility]  Chief Complaint  Patient presents with  . Routine Post Op    incisional hernia repair 12/03/2013    HISTORY: Patient is a 53 year old female who underwent incisional hernia repair on 12/03/2013 with mesh. Last week she came to the office and have her drain removed. She returns today for wound check and staple removal.  EXAM: Surgical incision continues to heal nicely. Remaining staples were removed. Steri-Strips are removed and new Steri-Strips are applied. On palpation there is no significant seroma present. There is no sign of infection.  IMPRESSION: Status post open ventral incisional hernia repair with mesh  PLAN: Patient will remove Steri-Strips in 5-7 days. She will begin to apply topical creams to her incision. She is scheduled to return to work on March 22. I've asked her to refrain from any strenuous lifting. She will return to see me in 4 weeks for wound check.  Earnstine Regal, MD, [facility]   Visit Diagnoses: 1. Incisional hernia

## 2013-12-28 ENCOUNTER — Telehealth (INDEPENDENT_AMBULATORY_CARE_PROVIDER_SITE_OTHER): Payer: Self-pay | Admitting: *Deleted

## 2013-12-28 NOTE — Telephone Encounter (Signed)
Pt called states she over did herself this past weekend.  She states she went to church and was standing up all day, also states she was exercising and lifting weights as well.  She feels that she needs one more week out of work.  Please advise if you are willing to extend her work note.  Anderson Malta

## 2013-12-29 ENCOUNTER — Encounter (INDEPENDENT_AMBULATORY_CARE_PROVIDER_SITE_OTHER): Payer: Self-pay

## 2013-12-29 NOTE — Telephone Encounter (Signed)
Pt advised of rtw date. RTW note at front desk to pick up. Pt aware.

## 2013-12-29 NOTE — Telephone Encounter (Signed)
Attached note should read I agree not I degree. Dr Harlow Asa gave verbal ok for pt to be out of work another week.

## 2013-12-29 NOTE — Telephone Encounter (Signed)
The patient contacted office after relatively vigorous physical activity complaining of abdominal discomfort at her surgical site. I degree that it is wise to keep her out of work for one additional week.  Patient will return for final wound check as scheduled.  Earnstine Regal, MD, [facility] Office: [phone]

## 2013-12-29 NOTE — Telephone Encounter (Signed)
Request printed and given to Dr Harlow Asa for review.

## 2013-12-31 ENCOUNTER — Telehealth (INDEPENDENT_AMBULATORY_CARE_PROVIDER_SITE_OTHER): Payer: Self-pay

## 2013-12-31 DIAGNOSIS — G8918 Other acute postprocedural pain: Secondary | ICD-10-CM

## 2013-12-31 MED ORDER — TRAMADOL HCL 50 MG PO TABS: 50.0000 mg | ORAL_TABLET | Freq: Four times a day (QID) | ORAL | Status: DC | PRN

## 2013-12-31 NOTE — Telephone Encounter (Signed)
Pt called requesting refill of pain med. Pt advised per protocol we can send order for Tramadol 50 mg # 30 one or two tabs po q 4-6 hours prn for pain. Pt states this will be fine. Pt also requests another copy of rtw note that Dr Harlow Asa signed for her on 12-29-13. Pt advised Dr Harlow Asa is not in office to sign the letter but I can give her a copy of the rtw letter. Pt states this will be fine. Rx sent to pharmacy.

## 2013-12-31 NOTE — Telephone Encounter (Signed)
I received a msg from front desk that advised pt stated rtw note was incorrect and pt was asking to me out until 01-11-14. The msg in epic I received on 12-29-13 states pt wants to be out one more week due to soreness. I reviewed this with Dr Harlow Asa and his response is attached. I called the pt on 12-29-13 and advised her the rtw date was extended to 01-04-14 per Dr Harlow Asa and the pt stated this was fine. Unless the pt has a complication she needs to plan on returning to work on 01-04-14. I have lmom for pt to call back for this msg.

## 2014-01-25 ENCOUNTER — Ambulatory Visit (INDEPENDENT_AMBULATORY_CARE_PROVIDER_SITE_OTHER): Payer: Commercial Managed Care - PPO | Admitting: Surgery

## 2014-01-26 ENCOUNTER — Other Ambulatory Visit: Payer: Self-pay | Admitting: Internal Medicine

## 2014-02-02 ENCOUNTER — Other Ambulatory Visit: Payer: Self-pay | Admitting: Internal Medicine

## 2014-02-04 ENCOUNTER — Ambulatory Visit (INDEPENDENT_AMBULATORY_CARE_PROVIDER_SITE_OTHER): Payer: Commercial Managed Care - PPO | Admitting: Surgery

## 2014-02-04 ENCOUNTER — Encounter (INDEPENDENT_AMBULATORY_CARE_PROVIDER_SITE_OTHER): Payer: Self-pay | Admitting: Surgery

## 2014-02-04 VITALS — BP 136/80 | HR 75 | Temp 97.5°F | Ht 65.0 in | Wt 253.4 lb

## 2014-02-04 DIAGNOSIS — K432 Incisional hernia without obstruction or gangrene: Secondary | ICD-10-CM

## 2014-02-04 NOTE — Patient Instructions (Signed)
  CARE OF INCISION   Apply cocoa butter/vitamin E cream (Palmer's brand) to your incision 2 - 3 times daily.  Massage cream into incision for one minute with each application.  Use sunscreen (50 SPF or higher) for first 6 months after surgery if area is exposed to sun.  You may alternate Mederma or other scar reducing cream with cocoa butter cream if desired.       Arel Tippen M. Trayven Lumadue, MD, FACS      [facility]      Office: [phone]    

## 2014-02-04 NOTE — Progress Notes (Signed)
General Surgery [facility]  Chief Complaint  Patient presents with  . Routine Post Op    ventral hernia repair 12/03/2013    HISTORY: Patient is a 54 year old female who underwent ventral hernia repair with mesh on 12/03/2013. She returns today for final postoperative visit.  EXAM: Surgical incision is well-healed. On palpation there is no sign of seroma. With Valsalva in a standing position there is no sign of recurrence. There is minimal tenderness.  IMPRESSION: Status post open ventral incisional hernia repair with mesh  PLAN: Patient is released to full activity without restriction. I have advised her to of which strenuous lifting. She will continue to apply topical creams to her incision.  Patient will return for surgical care as needed.  Earnstine Regal, MD, [facility]   Visit Diagnoses: 1. Incisional hernia

## 2014-02-08 ENCOUNTER — Telehealth: Payer: Self-pay

## 2014-02-08 NOTE — Telephone Encounter (Signed)
error 

## 2014-02-11 ENCOUNTER — Other Ambulatory Visit (INDEPENDENT_AMBULATORY_CARE_PROVIDER_SITE_OTHER): Payer: 59

## 2014-02-11 ENCOUNTER — Ambulatory Visit (INDEPENDENT_AMBULATORY_CARE_PROVIDER_SITE_OTHER): Payer: 59 | Admitting: Internal Medicine

## 2014-02-11 ENCOUNTER — Encounter: Payer: Self-pay | Admitting: Internal Medicine

## 2014-02-11 VITALS — BP 130/82 | HR 66 | Temp 98.5°F | Resp 14 | Wt 250.2 lb

## 2014-02-11 DIAGNOSIS — I10 Essential (primary) hypertension: Secondary | ICD-10-CM

## 2014-02-11 DIAGNOSIS — E785 Hyperlipidemia, unspecified: Secondary | ICD-10-CM

## 2014-02-11 DIAGNOSIS — R300 Dysuria: Secondary | ICD-10-CM

## 2014-02-11 DIAGNOSIS — K219 Gastro-esophageal reflux disease without esophagitis: Secondary | ICD-10-CM

## 2014-02-11 LAB — HEPATIC FUNCTION PANEL
ALT: 13 U/L (ref 0–35)
AST: 18 U/L (ref 0–37)
Albumin: 3.6 g/dL (ref 3.5–5.2)
Alkaline Phosphatase: 49 U/L (ref 39–117)
BILIRUBIN TOTAL: 0.5 mg/dL (ref 0.3–1.2)
Bilirubin, Direct: 0.1 mg/dL (ref 0.0–0.3)
Total Protein: 6.8 g/dL (ref 6.0–8.3)

## 2014-02-11 LAB — TSH: TSH: 0.74 u[IU]/mL (ref 0.35–5.50)

## 2014-02-11 LAB — LIPID PANEL
Cholesterol: 188 mg/dL (ref 0–200)
HDL: 57.9 mg/dL (ref >39.00)
LDL CALC: 105 mg/dL — AB (ref 0–99)
Total CHOL/HDL Ratio: 3
Triglycerides: 125 mg/dL (ref 0.0–149.0)
VLDL: 25 mg/dL (ref 0.0–40.0)

## 2014-02-11 LAB — CBC WITH DIFFERENTIAL/PLATELET
BASOS ABS: 0 10*3/uL (ref 0.0–0.1)
Basophils Relative: 0.6 % (ref 0.0–3.0)
Eosinophils Absolute: 0.1 10*3/uL (ref 0.0–0.7)
Eosinophils Relative: 1.8 % (ref 0.0–5.0)
HCT: 39 % (ref 36.0–46.0)
Hemoglobin: 13 g/dL (ref 12.0–15.0)
LYMPHS PCT: 44.1 % (ref 12.0–46.0)
Lymphs Abs: 1.7 10*3/uL (ref 0.7–4.0)
MCHC: 33.4 g/dL (ref 30.0–36.0)
MCV: 88.9 fl (ref 78.0–100.0)
MONOS PCT: 15 % — AB (ref 3.0–12.0)
Monocytes Absolute: 0.6 10*3/uL (ref 0.1–1.0)
NEUTROS PCT: 38.5 % — AB (ref 43.0–77.0)
Neutro Abs: 1.5 10*3/uL (ref 1.4–7.7)
Platelets: 200 10*3/uL (ref 150.0–400.0)
RBC: 4.39 Mil/uL (ref 3.87–5.11)
RDW: 14.5 % (ref 11.5–14.6)
WBC: 3.9 10*3/uL — ABNORMAL LOW (ref 4.5–10.5)

## 2014-02-11 LAB — URINALYSIS
BILIRUBIN URINE: NEGATIVE
HGB URINE DIPSTICK: NEGATIVE
Ketones, ur: NEGATIVE
LEUKOCYTES UA: NEGATIVE
Nitrite: NEGATIVE
Specific Gravity, Urine: 1.01 (ref 1.000–1.030)
TOTAL PROTEIN, URINE-UPE24: NEGATIVE
Urine Glucose: NEGATIVE
Urobilinogen, UA: 0.2 (ref 0.0–1.0)
pH: 8 (ref 5.0–8.0)

## 2014-02-11 LAB — BASIC METABOLIC PANEL
BUN: 11 mg/dL (ref 6–23)
CALCIUM: 9 mg/dL (ref 8.4–10.5)
CO2: 30 mEq/L (ref 19–32)
CREATININE: 0.7 mg/dL (ref 0.4–1.2)
Chloride: 101 mEq/L (ref 96–112)
GFR: 121.14 mL/min (ref >60.00)
GLUCOSE: 102 mg/dL — AB (ref 70–99)
Potassium: 4.1 mEq/L (ref 3.5–5.1)
Sodium: 137 mEq/L (ref 135–145)

## 2014-02-11 MED ORDER — SIMVASTATIN 40 MG PO TABS: 40.0000 mg | ORAL_TABLET | Freq: Every morning | ORAL | Status: DC

## 2014-02-11 MED ORDER — TRIAMTERENE-HCTZ 37.5-25 MG PO CAPS: 1.0000 | ORAL_CAPSULE | Freq: Every morning | ORAL | Status: DC

## 2014-02-11 MED ORDER — PANTOPRAZOLE SODIUM 40 MG PO TBEC: DELAYED_RELEASE_TABLET | ORAL | Status: DC

## 2014-02-11 MED ORDER — CLOBETASOL PROPIONATE 0.05 % EX CREA: 1.0000 "application " | TOPICAL_CREAM | Freq: Two times a day (BID) | CUTANEOUS | Status: DC | PRN

## 2014-02-11 MED ORDER — BUPROPION HCL ER (SR) 150 MG PO TB12: 150.0000 mg | ORAL_TABLET | Freq: Every morning | ORAL | Status: DC

## 2014-02-11 NOTE — Progress Notes (Signed)
Subjective:    Patient ID: Jody [name], female    DOB: 1960/10/20, 53 y.o.   MRN: [id number]  HPI  She is here for medical follow-up of her hypertension, hyperlipidemia, Jerrye Bushy, and bupropion therapy. She is essentially asymptomatic except for some dysuria. She has some calf discomfort on occasion which she describes as muscle spasm. She has been taking her PPI twice a day. She is not monitoring her blood pressure at home.  Compliant with anti hypertemsive medication. No lightheadedness or other adverse medication effect described.  A heart healthy /low salt diet is followed. Specifically denied are  chest pain, palpitations, dyspnea, or claudication.  Significant abdominal symptoms, memory deficit, or myalgias not present.       Review of Systems  Significant headaches, epistaxis, chest pain, palpitations, exertional dyspnea,  paroxysmal nocturnal dyspnea, or edema absent.  She has no unexplained weight loss, abdominal pain, significant dyspepsia, dysphagia, melena, rectal bleeding, or persistently small caliber stools. She denies  pyuria, hematuria, frequency, nocturia or polyuria. Bupropion effective in mood stabilization.     Objective:   Physical Exam Gen.: Healthy and well-nourished in appearance.  Weight excess.Alert, appropriate and cooperative throughout exam. Appears younger than stated age  Head: Normocephalic without obvious abnormalities Eyes: No corneal or conjunctival inflammation noted. Pupils equal round reactive to light and accommodation. Extraocular motion intact. Fundal exam is limited Ears: External  ear exam reveals no significant lesions or deformities.  Hearing is grossly normal bilaterally. Nose: External nasal exam reveals no deformity or inflammation. Nasal mucosa are pink and moist. No lesions or exudates noted.   Mouth: Oral mucosa and oropharynx reveal no lesions or exudates. Teeth in good repair. Neck: No deformities, masses, or tenderness noted.  Thyroid normal. Lungs: Normal respiratory effort; chest expands symmetrically. Lungs are clear to auscultation without rales, wheezes, or increased work of breathing. Heart: Normal rate and rhythm. Normal S1 and S2. No gallop, click, or rub.  S4 w/o murmur. Abdomen: Bowel sounds normal; abdomen soft and nontender. No masses, organomegaly or hernias noted. Lower abd op scar well healed. Genitalia:  as per Gyn                                  Musculoskeletal/extremities: No deformity or scoliosis noted of  the thoracic or lumbar spine.   No clubbing, cyanosis, edema, or significant extremity  deformity noted. Range of motion normal .Tone & strength normal. Hand joints normal Fingernail health good. Able to lie down & sit up w/o help. Negative SLR bilaterally Vascular: Carotid, radial artery, dorsalis pedis and  posterior tibial pulses are full and equal. No bruits present. Neurologic: Alert and oriented x3. Deep tendon reflexes symmetrical  But 0-1/2 +.  Gait normal        Skin: Intact without suspicious lesions or rashes. Lymph: No cervical, axillary lymphadenopathy present. Psych: Mood and affect are normal. Normally interactive                                                                                        Assessment & Plan:  See  Current Assessment & Plan in Problem List under specific DiagnosisThe labs will be reviewed and risks and options assessed. Written recommendations will be provided by mail or directly through My Chart.Further evaluation or change in medical therapy will be directed by those results.

## 2014-02-11 NOTE — Progress Notes (Signed)
Pre visit review using our clinic review tool, if applicable. No additional management support is needed unless otherwise documented below in the visit note. 

## 2014-02-11 NOTE — Progress Notes (Signed)
Jody [name] [id number] 02/11/2014  Chief Complaint  Patient presents with  . Medication Refill    Subjective  HPI  Here for medication refills...dyazide )treimterene-HCTZ) for BP. Has not been monitoring blood pressure at home. Sometimes skips a dose. Simvastatin for cholesterol. Protonix (acid reflux). Wellbutrin (Buprion) two or three years ago... Smoking cessation. 1/5 pack a day. CHLOBREL cream refilled.  Need to schedule an annual. July 2014, emergency gall bladder surgency. December 03 2013 hernia repair (umbilical region).  Sore and tender in RLQ/LLQ or back pain constant. Not related to surgery X 2 weeks. Aching pain. Hasn't tried anything for it. Hurts when she lays down and walks.  A little pain on urinary. No burning. Going to the bathroom more frequently. NO blood in the urine. No history of kidney stones. NO fever. NO chills, body aches. No weight loss. No painful intercourse. 4/5 on pain scale. Sometimes period, sometimes not. Gets cycle. LMP: 3 months ago. Will get pains when it comes. Sometimes less sometimes more. Occasional night sweats. Occasional hot falshes. Vaginal dryness. Exercising walking. Cutting out fried food. Eating vegetables, food, frutis. Still planning on quitting, but doesn't want to. NO bowel changes, dark urine (yellowish-organge). No odor. Occasional itching. No N/V/D.   Past Medical History  Diagnosis Date  . GERD (gastroesophageal reflux disease)   . Hepatitis C   . Hypertension   . Hyperlipidemia   . PPD positive   . Bowel habit changes   . Acute gangrenous cholecystitis 05/11/2013  . GERD (gastroesophageal reflux disease) 05/11/2013  . Other and unspecified hyperlipidemia 05/11/2013  . Tobacco use disorder 05/11/2013  . Obesity (BMI 35.0-39.9 without comorbidity) 05/11/2013  . Chronic back pain 05/11/2013  . Cholelithiasis without obstruction 05/11/2013  . Pre-diabetes     per pt  . Arthritis   . Lumbar back pain     with muscle spasms     Past Surgical History  Procedure Laterality Date  . Tonsillectomy  1978  . Tubal ligation  02/1987  . Appendectomy  1990  . Colonoscopy  2009, July 2014    found polyps 2014  . Umbilical surg      removed naval due to keloids/1995  . [place]    twins  . Cholecystectomy N/A 05/09/2013    Procedure: LAPAROSCOPIC CHOLECYSTECTOMY;  Surgeon: Earnstine Regal, MD;  Location: [facility];  Service: General;  Laterality: N/A;  . Cesarean section with bilateral tubal ligation    . Insertion of mesh N/A 12/03/2013    Procedure: INSERTION OF MESH;  Surgeon: Earnstine Regal, MD;  Location: [facility];  Service: General;  Laterality: N/A;  . Ventral hernia repair N/A 12/03/2013    Procedure: HERNIA REPAIR VENTRAL ADULT;  Surgeon: Earnstine Regal, MD;  Location: [facility];  Service: General;  Laterality: N/A;    Family History  Problem Relation Age of Onset  . Cancer Mother     Colon Cancer  . Hyperlipidemia Mother   . Hypertension Mother   . Colon cancer Mother   . Hypertension Father   . Diabetes Father   . Diabetes Sister   . Diabetes Maternal Grandfather     History  Substance Use Topics  . Smoking status: Current Every Day Smoker -- 0.21 packs/day    Types: Cigarettes    Start date: 05/08/2008  . Smokeless tobacco: Never Used     Comment: Smokes 3 cigarettes a day.  . Alcohol Use: 2.2 oz/week    1 Glasses of  wine, 1 Cans of beer, 2 Drinks containing 0.5 oz of alcohol per week     Comment: increased 4 drinks a week for one year    Current Outpatient Prescriptions on File Prior to Visit  Medication Sig Dispense Refill  . ALPRAZolam (XANAX) 0.5 MG tablet Take 0.5 mg by mouth at bedtime as needed for sleep or anxiety.      . baclofen (LIORESAL) 10 MG tablet Take 10 mg by mouth 3 (three) times daily as needed for muscle spasms.      Marland Kitchen buPROPion (WELLBUTRIN SR) 150 MG 12 hr tablet Take 150 mg by mouth every morning.      . meloxicam (MOBIC) 15 MG tablet Take 15 mg by mouth daily.      .  pantoprazole (PROTONIX) 40 MG tablet Take 40 mg by mouth 2 (two) times daily.      . simvastatin (ZOCOR) 40 MG tablet Take 40 mg by mouth every morning.      . triamterene-hydrochlorothiazide (DYAZIDE) 37.5-25 MG per capsule Take 1 capsule by mouth every morning.       No current facility-administered medications on file prior to visit.     Allergies:No Known Allergies  ROS     Objective  Filed Vitals:   02/11/14 1017  BP: 130/82  Pulse: 66  Temp: 98.5 F (36.9 C)  TempSrc: Oral  Weight: 250 lb 3.2 oz (113.49 kg)  SpO2: 96%    Physical Exam  BP Readings from Last 3 Encounters:  02/11/14 130/82  02/04/14 136/80  12/22/13 140/94    Wt Readings from Last 3 Encounters:  02/11/14 250 lb 3.2 oz (113.49 kg)  02/04/14 253 lb 6.4 oz (114.941 kg)  12/22/13 253 lb (114.76 kg)    Lab Results  Component Value Date   WBC 7.4 12/03/2013   HGB 12.3 12/03/2013   HCT 37.6 12/03/2013   PLT 185 12/03/2013   GLUCOSE 152* 11/25/2013   CHOL 229* 09/17/2012   TRIG 129.0 09/17/2012   HDL 53.40 09/17/2012   LDLDIRECT 165.2 09/17/2012   ALT 13 11/25/2013   AST 17 11/25/2013   NA 139 11/25/2013   K 3.6* 11/25/2013   CL 100 11/25/2013   CREATININE 0.69 12/03/2013   BUN 15 11/25/2013   CO2 27 11/25/2013   TSH 1.03 09/17/2012   HGBA1C 6.1 09/17/2012    Dg Chest 2 View  11/25/2013   CLINICAL DATA:  Preoperative for incisional hernia repair. Hypertension.  EXAM: CHEST  2 VIEW  COMPARISON:  DG CHEST 2 VIEW dated 12/08/2004  FINDINGS: The heart size and mediastinal contours are within normal limits. Both lungs are clear. The visualized skeletal structures are unremarkable.  IMPRESSION: No active cardiopulmonary disease.   Electronically Signed   By: Sherryl Barters M.D.   On: 11/25/2013 15:18       Assessment and Plan  No Follow-up on file. Berenice Bouton, Student-PA

## 2014-02-11 NOTE — Patient Instructions (Signed)
Your next office appointment will be determined based upon review of your pending labs. Those instructions will be transmitted to you through My Chart   Reflux of gastric acid may be asymptomatic as this may occur mainly during sleep.The triggers for reflux  include stress; the "aspirin family" ; alcohol; peppermint; and caffeine (coffee, tea, cola, and chocolate). The aspirin family would include aspirin and the nonsteroidal agents such as ibuprofen &  Naproxen. Tylenol would not cause reflux. If having symptoms ; food & drink should be avoided for @ least 2 hours before going to bed.   Minimal Blood Pressure Goal= AVERAGE < 140/90;  Ideal is an AVERAGE < 135/85. This AVERAGE should be calculated from @ least 5-7 BP readings taken @ different times of day on different days of week. You should not respond to isolated BP readings , but rather the AVERAGE for that week .Please bring your  blood pressure cuff to office visits to verify that it is reliable.It  can also be checked against the blood pressure device at the pharmacy. Finger or wrist cuffs are not dependable; an arm cuff is. 

## 2014-02-12 ENCOUNTER — Telehealth: Payer: Self-pay

## 2014-02-12 ENCOUNTER — Ambulatory Visit: Payer: 59

## 2014-02-12 DIAGNOSIS — R7309 Other abnormal glucose: Secondary | ICD-10-CM

## 2014-02-12 DIAGNOSIS — Z1231 Encounter for screening mammogram for malignant neoplasm of breast: Secondary | ICD-10-CM

## 2014-02-12 LAB — HEMOGLOBIN A1C: Hgb A1c MFr Bld: 5.9 % (ref 4.6–6.5)

## 2014-02-12 NOTE — Assessment & Plan Note (Signed)
CBC & dif  Anti reflux measures PPI pre b'fast only; GI referral if needed bid

## 2014-02-12 NOTE — Assessment & Plan Note (Signed)
Blood pressure goals reviewed. BMET 

## 2014-02-12 NOTE — Assessment & Plan Note (Signed)
Lipids, LFTs, TSH  

## 2014-02-12 NOTE — Telephone Encounter (Signed)
Message copied by Shelly Coss on Fri Feb 12, 2014  8:07 AM ------      Message from: Hendricks Limes      Created: Thu Feb 11, 2014  5:51 PM       Please add A1c (790.29)       ------

## 2014-02-12 NOTE — Telephone Encounter (Signed)
Request for add on lab has been faxed  

## 2014-02-19 ENCOUNTER — Telehealth: Payer: Self-pay

## 2014-02-19 NOTE — Telephone Encounter (Signed)
[facility] request rx for Chantix; since pt is [employer] employee can get Chantix at no copay. Please advise.

## 2014-02-22 ENCOUNTER — Other Ambulatory Visit: Payer: Self-pay | Admitting: Internal Medicine

## 2014-02-22 MED ORDER — VARENICLINE TARTRATE 0.5 MG X 11 & 1 MG X 42 PO MISC: ORAL | Status: DC

## 2014-02-22 NOTE — Progress Notes (Signed)
Rx faxed to pharmacy  

## 2014-02-22 NOTE — Addendum Note (Signed)
Addended by: Lurlean Nanny on: 02/22/2014 11:52 AM   Modules accepted: Orders

## 2014-02-22 NOTE — Progress Notes (Signed)
I printed Rx due to instructions--will place in your box to sign so i can fax

## 2014-03-22 ENCOUNTER — Ambulatory Visit
Admission: RE | Admit: 2014-03-22 | Discharge: 2014-03-22 | Disposition: A | Discharge status: Home / Self Care | Payer: 59 | Source: Ambulatory Visit

## 2014-04-08 ENCOUNTER — Other Ambulatory Visit: Payer: Self-pay | Admitting: Internal Medicine

## 2014-04-08 MED ORDER — VARENICLINE TARTRATE 1 MG PO TABS: 1.0000 mg | ORAL_TABLET | Freq: Two times a day (BID) | ORAL | Status: DC

## 2014-04-08 NOTE — Telephone Encounter (Signed)
This is the started pack. I have sent in the continuing month pack

## 2014-04-08 NOTE — Telephone Encounter (Signed)
Last filled 02/22/14 and last OV was 04/2013--please advise

## 2014-05-24 ENCOUNTER — Other Ambulatory Visit: Payer: Self-pay

## 2014-05-24 ENCOUNTER — Other Ambulatory Visit: Payer: Self-pay | Admitting: Internal Medicine

## 2014-05-24 ENCOUNTER — Encounter: Payer: Self-pay | Admitting: Internal Medicine

## 2014-05-24 MED ORDER — TRIAMTERENE-HCTZ 37.5-25 MG PO CAPS: 1.0000 | ORAL_CAPSULE | Freq: Every morning | ORAL | Status: DC

## 2014-05-24 MED ORDER — PANTOPRAZOLE SODIUM 40 MG PO TBEC: DELAYED_RELEASE_TABLET | ORAL | Status: DC

## 2014-05-31 ENCOUNTER — Other Ambulatory Visit: Payer: Self-pay | Admitting: Internal Medicine

## 2014-06-01 ENCOUNTER — Other Ambulatory Visit: Payer: Self-pay

## 2014-06-01 MED ORDER — SIMVASTATIN 40 MG PO TABS: 40.0000 mg | ORAL_TABLET | Freq: Every morning | ORAL | Status: DC

## 2014-08-19 ENCOUNTER — Ambulatory Visit: Payer: 59 | Admitting: Internal Medicine

## 2014-08-27 ENCOUNTER — Other Ambulatory Visit: Payer: Self-pay | Admitting: Internal Medicine

## 2014-08-30 ENCOUNTER — Encounter: Payer: Self-pay | Admitting: Family

## 2014-08-30 ENCOUNTER — Telehealth: Payer: Self-pay | Admitting: Family

## 2014-08-30 ENCOUNTER — Ambulatory Visit (INDEPENDENT_AMBULATORY_CARE_PROVIDER_SITE_OTHER): Payer: 59 | Admitting: Family

## 2014-08-30 VITALS — BP 140/84 | HR 58 | Temp 98.2°F | Resp 20 | Ht 65.5 in | Wt 259.8 lb

## 2014-08-30 DIAGNOSIS — K219 Gastro-esophageal reflux disease without esophagitis: Secondary | ICD-10-CM

## 2014-08-30 DIAGNOSIS — G8929 Other chronic pain: Secondary | ICD-10-CM

## 2014-08-30 DIAGNOSIS — I10 Essential (primary) hypertension: Secondary | ICD-10-CM

## 2014-08-30 DIAGNOSIS — Z0000 Encounter for general adult medical examination without abnormal findings: Secondary | ICD-10-CM | POA: Insufficient documentation

## 2014-08-30 DIAGNOSIS — E785 Hyperlipidemia, unspecified: Secondary | ICD-10-CM | POA: Insufficient documentation

## 2014-08-30 DIAGNOSIS — M549 Dorsalgia, unspecified: Secondary | ICD-10-CM

## 2014-08-30 MED ORDER — SIMVASTATIN 40 MG PO TABS: 40.0000 mg | ORAL_TABLET | Freq: Every morning | ORAL | Status: DC

## 2014-08-30 MED ORDER — DIAZEPAM 5 MG PO TABS: 5.0000 mg | ORAL_TABLET | Freq: Three times a day (TID) | ORAL | Status: DC | PRN

## 2014-08-30 MED ORDER — CLOBETASOL PROPIONATE 0.05 % EX CREA: 1.0000 "application " | TOPICAL_CREAM | Freq: Two times a day (BID) | CUTANEOUS | Status: DC | PRN

## 2014-08-30 MED ORDER — PANTOPRAZOLE SODIUM 40 MG PO TBEC: DELAYED_RELEASE_TABLET | ORAL | Status: DC

## 2014-08-30 MED ORDER — TRIAMTERENE-HCTZ 37.5-25 MG PO CAPS
ORAL_CAPSULE | ORAL | Status: DC
Start: 2014-08-30 — End: 2015-07-25

## 2014-08-30 NOTE — Progress Notes (Signed)
Subjective:    Patient ID: Jody [name], female    DOB: 06/11/1961, 53 y.o.   MRN: [id number]  Chief Complaint  Patient presents with  . Establish Care    HPI:  Jody [name] is a 53 y.o. female who presents today for an annual wellness visit.  1) Health Maintenance - Overall health has been decent, had mesh put in for a hernia and her gall bladder removed. For the last month has had some pain in her back near her kidney.   Diet - Eats fruits and vegetables. Has some challenges eating a healthy diet secondary to working nights. Trying to add more fiber and whole grains into her diet.  Exercise - No structured exercise at this time.   2) Preventative Exams / Immunizations:  Dental -- Up to date Vision -- Up to date Colonoscopy -- PAP -- Will schedule with GYN   Health Maintenance  Topic Date Due  . TETANUS/TDAP  07/10/1981  . INFLUENZA VACCINE  05/15/2014  . PAP SMEAR  08/15/2014  . MAMMOGRAM  03/22/2016  . COLONOSCOPY  04/24/2018   Immunization History  Administered Date(s) Administered  . Influenza-Unspecified 07/15/2012, 07/30/2014  . Pneumococcal Polysaccharide-23 05/10/2013   Declined shingles vaccination.   3) Hypertension - currently maintained on the Dyazide. Last eye exam is up to date. Last kidney function up to date.   BP Readings from Last 3 Encounters:  08/30/14 140/84  02/11/14 130/82  02/04/14 136/80   Lab Results  Component Value Date   CREATININE 0.7 02/11/2014   CREATININE 0.69 12/03/2013   CREATININE 0.78 11/25/2013   4) Anxiety - Feeling slightly increased amounts of stressful things in her life right now. Indicates that she previously taken as needed.   5) Back soreness - has been going for a while now. Notes it to run from mid-throacic spine to lower back. Described as numbness and tingling on occasion. Has tried several OTC remedies including topical creams, ibuprofen and ice/heat therapy, with little resolution. Not really  getting better or worse, staying about the same.  6) Hyperlipidemia - Currently maintained on simvastatin. Denies any muscle pains, flu like symptoms or changes to urination.   No Known Allergies  Current Outpatient Prescriptions  Medication Sig Dispense Refill  . clobetasol cream (TEMOVATE) 0.37 % Apply 1 application topically 2 (two) times daily as needed. 45 g 1  . pantoprazole (PROTONIX) 40 MG tablet TAKE 1 TABLET BY MOUTH 30 MINUTES BEFORE BREAKFAST 90 tablet 3  . simvastatin (ZOCOR) 40 MG tablet Take 1 tablet (40 mg total) by mouth every morning. 90 tablet 1  . triamterene-hydrochlorothiazide (DYAZIDE) 37.5-25 MG per capsule TAKE 1 CAPSULE BY MOUTH EVERY MORNING. 90 capsule 3  . diazepam (VALIUM) 5 MG tablet Take 1 tablet (5 mg total) by mouth every 8 (eight) hours as needed for anxiety or muscle spasms. 30 tablet 0   No current facility-administered medications for this visit.     Review of Systems  Constitutional: Denies fever, chills, fatigue, or significant weight gain/loss. HENT: Head: Denies headache or neck pain Ears: Denies changes in hearing, ringing in ears, earache, drainage Nose: Denies discharge, stuffiness, itching, nosebleed, sinus pain Throat: Denies sore throat, hoarseness, dry mouth, sores, thrush Eyes: Denies loss/changes in vision, pain, redness, blurry/double vision, flashing lights Cardiovascular: Denies chest pain/discomfort, tightness, palpitations, shortness of breath with activity, difficulty lying down, swelling, sudden awakening with shortness of breath Respiratory: Denies shortness of breath, cough, sputum production, wheezing Gastrointestinal: Denies dysphasia, heartburn,  change in appetite, nausea, change in bowel habits, rectal bleeding, constipation, diarrhea, yellow skin or eyes Genitourinary: Denies frequency, urgency, burning/pain, blood in urine, incontinence, change in urinary strength. Musculoskeletal: Denies muscle/joint pain, stiffness,  redness or swelling of joints, trauma Back pain as above Skin: Denies rashes, lumps, itching, dryness, color changes, or hair/nail changes Neurological: Denies dizziness, fainting, seizures, weakness, numbness, tingling, tremor Psychiatric - Denies nervousness, stress, depression or memory loss Anxiety as above Endocrine: Denies heat or cold intolerance, sweating, frequent urination, excessive thirst, changes in appetite Hematologic: Denies ease of bruising or bleeding    Objective:    BP 140/84 mmHg  Pulse 58  Temp(Src) 98.2 F (36.8 C) (Oral)  Resp 20  Ht 5' 5.5" (1.664 m)  Wt 259 lb 12 oz (117.822 kg)  BMI 42.55 kg/m2  SpO2 95% Nursing note and vital signs reviewed.  Physical Exam  Constitutional: She is oriented to person, place, and time. No distress.  Obese female seated in the chair, dressed appropriately, appears her stated age.   HENT:  Head: Normocephalic.  Right Ear: Hearing, tympanic membrane, external ear and ear canal normal.  Left Ear: Hearing, tympanic membrane, external ear and ear canal normal.  Nose: Nose normal.  Mouth/Throat: Uvula is midline, oropharynx is clear and moist and mucous membranes are normal.  Eyes: Conjunctivae and EOM are normal. Pupils are equal, round, and reactive to light.  Neck: Neck supple. No JVD present. No tracheal deviation present. No thyromegaly present.  Cardiovascular: Normal rate, regular rhythm, normal heart sounds and intact distal pulses.   Pulmonary/Chest: Effort normal and breath sounds normal.  Abdominal: Soft. Bowel sounds are normal. She exhibits no distension and no mass. There is no tenderness. There is no rebound and no guarding.  Musculoskeletal: Normal range of motion. She exhibits no edema or tenderness.  Muscle tenderness noted left paraspinal musculature. No obvious deformity, discoloration or edema. ROM intact and appropriate. Reflexes and sensation intact and appropriate.   Lymphadenopathy:    She has no  cervical adenopathy.  Neurological: She is alert and oriented to person, place, and time. She has normal reflexes. No cranial nerve deficit. She exhibits normal muscle tone. Coordination normal.  Skin: Skin is warm and dry.  Psychiatric: She has a normal mood and affect. Her behavior is normal. Judgment and thought content normal.       Assessment & Plan:

## 2014-08-30 NOTE — Telephone Encounter (Signed)
emmi mailed  °

## 2014-08-30 NOTE — Assessment & Plan Note (Signed)
Stable - obtain lipid profile. Continue current dose of simvastatin.

## 2014-08-30 NOTE — Progress Notes (Signed)
Pre visit review using our clinic review tool, if applicable. No additional management support is needed unless otherwise documented below in the visit note. 

## 2014-08-30 NOTE — Patient Instructions (Signed)
Thank you for choosing [facility].  Summary/Instructions:  Your prescription(s) have been submitted to your pharmacy. Please take as directed and contact our office if you believe you are having problem(s) with the medication(s).  Please stop by the lab on the basement level of the building for your blood work. Your results will be released to [place] (or called to you) after review, usually within 72hours after test completion. If any changes need to be made, you will be notified at that same time.  Please make an appointment with GYN for your PAP smear.   Health Maintenance Adopting a healthy lifestyle and getting preventive care can go a long way to promote health and wellness. Talk with your health care provider about what schedule of regular examinations is right for you. This is a good chance for you to check in with your provider about disease prevention and staying healthy. In between checkups, there are plenty of things you can do on your own. Experts have done a lot of research about which lifestyle changes and preventive measures are most likely to keep you healthy. Ask your health care provider for more information. WEIGHT AND DIET  Eat a healthy diet  Be sure to include plenty of vegetables, fruits, low-fat dairy products, and lean protein.  Do not eat a lot of foods high in solid fats, added sugars, or salt.  Get regular exercise. This is one of the most important things you can do for your health.  Most adults should exercise for at least 150 minutes each week. The exercise should increase your heart rate and make you sweat (moderate-intensity exercise).  Most adults should also do strengthening exercises at least twice a week. This is in addition to the moderate-intensity exercise.  Maintain a healthy weight  Body mass index (BMI) is a measurement that can be used to identify possible weight problems. It estimates body fat based on height and weight. Your health  care provider can help determine your BMI and help you achieve or maintain a healthy weight.  For females 108 years of age and older:   A BMI below 18.5 is considered underweight.  A BMI of 18.5 to 24.9 is normal.  A BMI of 25 to 29.9 is considered overweight.  A BMI of 30 and above is considered obese.  Watch levels of cholesterol and blood lipids  You should start having your blood tested for lipids and cholesterol at 52 years of age, then have this test every 5 years.  You may need to have your cholesterol levels checked more often if:  Your lipid or cholesterol levels are high.  You are older than 52 years of age.  You are at high risk for heart disease.  CANCER SCREENING   Lung Cancer  Lung cancer screening is recommended for adults 41-76 years old who are at high risk for lung cancer because of a history of smoking.  A yearly low-dose CT scan of the lungs is recommended for people who:  Currently smoke.  Have quit within the past 15 years.  Have at least a 30-pack-year history of smoking. A pack year is smoking an average of one pack of cigarettes a day for 1 year.  Yearly screening should continue until it has been 15 years since you quit.  Yearly screening should stop if you develop a health problem that would prevent you from having lung cancer treatment.  Breast Cancer  Practice breast self-awareness. This means understanding how your breasts normally appear  and feel.  It also means doing regular breast self-exams. Let your health care provider know about any changes, no matter how small.  If you are in your 20s or 30s, you should have a clinical breast exam (CBE) by a health care provider every 1-3 years as part of a regular health exam.  If you are 40 or older, have a CBE every year. Also consider having a breast X-ray (mammogram) every year.  If you have a family history of breast cancer, talk to your health care provider about genetic  screening.  If you are at high risk for breast cancer, talk to your health care provider about having an MRI and a mammogram every year.  Breast cancer gene (BRCA) assessment is recommended for women who have family members with BRCA-related cancers. BRCA-related cancers include:  Breast.  Ovarian.  Tubal.  Peritoneal cancers.  Results of the assessment will determine the need for genetic counseling and BRCA1 and BRCA2 testing. Cervical Cancer Routine pelvic examinations to screen for cervical cancer are no longer recommended for nonpregnant women who are considered low risk for cancer of the pelvic organs (ovaries, uterus, and vagina) and who do not have symptoms. A pelvic examination may be necessary if you have symptoms including those associated with pelvic infections. Ask your health care provider if a screening pelvic exam is right for you.   The Pap test is the screening test for cervical cancer for women who are considered at risk.  If you had a hysterectomy for a problem that was not cancer or a condition that could lead to cancer, then you no longer need Pap tests.  If you are older than 65 years, and you have had normal Pap tests for the past 10 years, you no longer need to have Pap tests.  If you have had past treatment for cervical cancer or a condition that could lead to cancer, you need Pap tests and screening for cancer for at least 20 years after your treatment.  If you no longer get a Pap test, assess your risk factors if they change (such as having a new sexual partner). This can affect whether you should start being screened again.  Some women have medical problems that increase their chance of getting cervical cancer. If this is the case for you, your health care provider may recommend more frequent screening and Pap tests.  The human papillomavirus (HPV) test is another test that may be used for cervical cancer screening. The HPV test looks for the virus that can  cause cell changes in the cervix. The cells collected during the Pap test can be tested for HPV.  The HPV test can be used to screen women 30 years of age and older. Getting tested for HPV can extend the interval between normal Pap tests from three to five years.  An HPV test also should be used to screen women of any age who have unclear Pap test results.  After 52 years of age, women should have HPV testing as often as Pap tests.  Colorectal Cancer  This type of cancer can be detected and often prevented.  Routine colorectal cancer screening usually begins at 52 years of age and continues through 52 years of age.  Your health care provider may recommend screening at an earlier age if you have risk factors for colon cancer.  Your health care provider may also recommend using home test kits to check for hidden blood in the stool.  A small   camera at the end of a tube can be used to examine your colon directly (sigmoidoscopy or colonoscopy). This is done to check for the earliest forms of colorectal cancer.  Routine screening usually begins at age 47.  Direct examination of the colon should be repeated every 5-10 years through 52 years of age. However, you may need to be screened more often if early forms of precancerous polyps or small growths are found. Skin Cancer  Check your skin from head to toe regularly.  Tell your health care provider about any new moles or changes in moles, especially if there is a change in a mole's shape or color.  Also tell your health care provider if you have a mole that is larger than the size of a pencil eraser.  Always use sunscreen. Apply sunscreen liberally and repeatedly throughout the day.  Protect yourself by wearing long sleeves, pants, a wide-brimmed hat, and sunglasses whenever you are outside. HEART DISEASE, DIABETES, AND HIGH BLOOD PRESSURE   Have your blood pressure checked at least every 1-2 years. High blood pressure causes heart  disease and increases the risk of stroke.  If you are between 61 years and 67 years old, ask your health care provider if you should take aspirin to prevent strokes.  Have regular diabetes screenings. This involves taking a blood sample to check your fasting blood sugar level.  If you are at a normal weight and have a low risk for diabetes, have this test once every three years after 52 years of age.  If you are overweight and have a high risk for diabetes, consider being tested at a younger age or more often. PREVENTING INFECTION  Hepatitis B  If you have a higher risk for hepatitis B, you should be screened for this virus. You are considered at high risk for hepatitis B if:  You were born in a country where hepatitis B is common. Ask your health care provider which countries are considered high risk.  Your parents were born in a high-risk country, and you have not been immunized against hepatitis B (hepatitis B vaccine).  You have HIV or AIDS.  You use needles to inject street drugs.  You live with someone who has hepatitis B.  You have had sex with someone who has hepatitis B.  You get hemodialysis treatment.  You take certain medicines for conditions, including cancer, organ transplantation, and autoimmune conditions. Hepatitis C  Blood testing is recommended for:  Everyone born from [birth year] through 1964.  Anyone with known risk factors for hepatitis C. Sexually transmitted infections (STIs)  You should be screened for sexually transmitted infections (STIs) including gonorrhea and chlamydia if:  You are sexually active and are younger than 52 years of age.  You are older than 52 years of age and your health care provider tells you that you are at risk for this type of infection.  Your sexual activity has changed since you were last screened and you are at an increased risk for chlamydia or gonorrhea. Ask your health care provider if you are at risk.  If you do not have  HIV, but are at risk, it may be recommended that you take a prescription medicine daily to prevent HIV infection. This is called pre-exposure prophylaxis (PrEP). You are considered at risk if:  You are sexually active and do not regularly use condoms or know the HIV status of your partner(s).  You take drugs by injection.  You are sexually active with a  partner who has HIV. Talk with your health care provider about whether you are at high risk of being infected with HIV. If you choose to begin PrEP, you should first be tested for HIV. You should then be tested every 3 months for as long as you are taking PrEP.  PREGNANCY   If you are premenopausal and you may become pregnant, ask your health care provider about preconception counseling.  If you may become pregnant, take 400 to 800 micrograms (mcg) of folic acid every day.  If you want to prevent pregnancy, talk to your health care provider about birth control (contraception). OSTEOPOROSIS AND MENOPAUSE   Osteoporosis is a disease in which the bones lose minerals and strength with aging. This can result in serious bone fractures. Your risk for osteoporosis can be identified using a bone density scan.  If you are 65 years of age or older, or if you are at risk for osteoporosis and fractures, ask your health care provider if you should be screened.  Ask your health care provider whether you should take a calcium or vitamin D supplement to lower your risk for osteoporosis.  Menopause may have certain physical symptoms and risks.  Hormone replacement therapy may reduce some of these symptoms and risks. Talk to your health care provider about whether hormone replacement therapy is right for you.  HOME CARE INSTRUCTIONS   Schedule regular health, dental, and eye exams.  Stay current with your immunizations.   Do not use any tobacco products including cigarettes, chewing tobacco, or electronic cigarettes.  If you are pregnant, do not  drink alcohol.  If you are breastfeeding, limit how much and how often you drink alcohol.  Limit alcohol intake to no more than 1 drink per day for nonpregnant women. One drink equals 12 ounces of beer, 5 ounces of wine, or 1 ounces of hard liquor.  Do not use street drugs.  Do not share needles.  Ask your health care provider for help if you need support or information about quitting drugs.  Tell your health care provider if you often feel depressed.  Tell your health care provider if you have ever been abused or do not feel safe at home. Document Released: 04/16/2011 Document Revised: 02/15/2014 Document Reviewed: 09/02/2013 Exit[employer] Patient Information 2015 [employer]. This information is not intended to replace advice given to you by your health care provider. Make sure you discuss any questions you have with your health care provider.   

## 2014-08-30 NOTE — Assessment & Plan Note (Signed)
1) Anticipatory Guidance: Discussed importance of wearing a seatbelt while driving and not texting while driving; changing batteries in smoke detector at least once annually; wearing suntan lotion when outside; eating a balanced and moderate diet; getting physical activity at least 30 minutes per day.  2) Immunizations / Screenings / Labs:  Discussed shingles vaccination, which pt is declining at this time. All other immunizations up to date. / Pt will schedule PAP with GYN, all other screenings are up to date. / Obtain CBC, BMET, TSH, lipid profile, hepatic panel.   Overall well exam. Discussed risk factors of being obese and the lifestyle modifications as above. Continue current medications at this time. Follow up in 1 year or sooner pending lab results.

## 2014-08-30 NOTE — Assessment & Plan Note (Signed)
Stable continue pantoprazole

## 2014-08-30 NOTE — Assessment & Plan Note (Signed)
Continues to experience some back pain. Start valium 5 mg as needed for both muscle spasm and sleep.

## 2014-08-31 ENCOUNTER — Telehealth: Payer: Self-pay | Admitting: Family

## 2014-08-31 NOTE — Telephone Encounter (Signed)
emmi mailed  °

## 2015-02-16 ENCOUNTER — Other Ambulatory Visit: Payer: Self-pay | Admitting: Family

## 2015-02-16 NOTE — Telephone Encounter (Signed)
Needs office visit. Has not been seen since November.

## 2015-07-21 ENCOUNTER — Telehealth: Payer: Self-pay | Admitting: Family

## 2015-07-21 DIAGNOSIS — K219 Gastro-esophageal reflux disease without esophagitis: Secondary | ICD-10-CM

## 2015-07-21 DIAGNOSIS — I10 Essential (primary) hypertension: Secondary | ICD-10-CM

## 2015-07-21 DIAGNOSIS — E785 Hyperlipidemia, unspecified: Secondary | ICD-10-CM

## 2015-07-21 NOTE — Telephone Encounter (Signed)
Patient has an appt for a CPE on 07/29/2015, but is completley out of all 5 meds. She requests that they be filled to [facility] on [address] as listed on pharmacy list. i have updated this along with current employment status.

## 2015-07-22 NOTE — Telephone Encounter (Signed)
Ok to refill meds

## 2015-07-25 MED ORDER — PANTOPRAZOLE SODIUM 40 MG PO TBEC: DELAYED_RELEASE_TABLET | ORAL | Status: DC

## 2015-07-25 MED ORDER — SIMVASTATIN 40 MG PO TABS: 40.0000 mg | ORAL_TABLET | Freq: Every morning | ORAL | Status: DC

## 2015-07-25 MED ORDER — TRIAMTERENE-HCTZ 37.5-25 MG PO CAPS: ORAL_CAPSULE | ORAL | Status: DC

## 2015-07-25 NOTE — Telephone Encounter (Signed)
Sent maintenance med to [facility]...Jody [name]

## 2015-07-25 NOTE — Addendum Note (Signed)
Addended by: Earnstine Regal on: 07/25/2015 09:04 AM   Modules accepted: Orders

## 2015-07-29 ENCOUNTER — Encounter: Payer: Self-pay | Admitting: Family

## 2015-07-29 ENCOUNTER — Ambulatory Visit (INDEPENDENT_AMBULATORY_CARE_PROVIDER_SITE_OTHER): Payer: Self-pay | Admitting: Family

## 2015-07-29 VITALS — BP 138/86 | HR 56 | Temp 98.0°F | Resp 18 | Ht 65.5 in | Wt 250.8 lb

## 2015-07-29 DIAGNOSIS — I10 Essential (primary) hypertension: Secondary | ICD-10-CM

## 2015-07-29 DIAGNOSIS — E785 Hyperlipidemia, unspecified: Secondary | ICD-10-CM

## 2015-07-29 DIAGNOSIS — Z23 Encounter for immunization: Secondary | ICD-10-CM

## 2015-07-29 DIAGNOSIS — K219 Gastro-esophageal reflux disease without esophagitis: Secondary | ICD-10-CM

## 2015-07-29 DIAGNOSIS — Z0000 Encounter for general adult medical examination without abnormal findings: Secondary | ICD-10-CM

## 2015-07-29 DIAGNOSIS — K580 Irritable bowel syndrome with diarrhea: Secondary | ICD-10-CM

## 2015-07-29 MED ORDER — CLOBETASOL PROPIONATE 0.05 % EX CREA: 1.0000 "application " | TOPICAL_CREAM | Freq: Two times a day (BID) | CUTANEOUS | Status: AC | PRN

## 2015-07-29 MED ORDER — TRIAMTERENE-HCTZ 37.5-25 MG PO CAPS: ORAL_CAPSULE | ORAL | Status: DC

## 2015-07-29 MED ORDER — SIMVASTATIN 40 MG PO TABS: 40.0000 mg | ORAL_TABLET | Freq: Every morning | ORAL | Status: AC

## 2015-07-29 MED ORDER — ELUXADOLINE 100 MG PO TABS: 100.0000 mg | ORAL_TABLET | Freq: Two times a day (BID) | ORAL | Status: AC

## 2015-07-29 MED ORDER — PANTOPRAZOLE SODIUM 40 MG PO TBEC: DELAYED_RELEASE_TABLET | ORAL | Status: AC

## 2015-07-29 MED ORDER — SIMVASTATIN 40 MG PO TABS: 40.0000 mg | ORAL_TABLET | Freq: Every morning | ORAL | Status: DC

## 2015-07-29 MED ORDER — DIAZEPAM 5 MG PO TABS: 5.0000 mg | ORAL_TABLET | Freq: Three times a day (TID) | ORAL | Status: AC | PRN

## 2015-07-29 MED ORDER — CLOBETASOL PROPIONATE 0.05 % EX CREA: 1.0000 "application " | TOPICAL_CREAM | Freq: Two times a day (BID) | CUTANEOUS | Status: DC | PRN

## 2015-07-29 MED ORDER — TRIAMTERENE-HCTZ 37.5-25 MG PO CAPS: ORAL_CAPSULE | ORAL | Status: AC

## 2015-07-29 MED ORDER — PANTOPRAZOLE SODIUM 40 MG PO TBEC: DELAYED_RELEASE_TABLET | ORAL | Status: DC

## 2015-07-29 NOTE — Progress Notes (Signed)
Pre visit review using our clinic review tool, if applicable. No additional management support is needed unless otherwise documented below in the visit note. 

## 2015-07-29 NOTE — Assessment & Plan Note (Signed)
1) Anticipatory Guidance: Discussed importance of wearing a seatbelt while driving and not texting while driving; changing batteries in smoke detector at least once annually; wearing suntan lotion when outside; eating a balanced and moderate diet; getting physical activity at least 30 minutes per day.  2) Immunizations / Screenings / Labs:  Flu shot updated today. All other immunizations are up to date per recommendations. Declines HIV. All other screenings are up to date per recommendations. Obtain CBC, CMET, Lipid profile and TSH.   Overall well exam with risk factors for cardiovascular and chronic disease include obesity, hypertension and hyperlipidemia. Hypertension is currently adequately controlled with medication. Obtain lipid profile to determine current lipid status. Obesity with goals of improving nutrient density of foods and decreasing saturated fat intake. Also recommend 30 minutes of physical activity daily. Establish goal weight loss of 5-10% of current body weight. Follow up prevention exam in 1 year. Follow up office visit pending lab work.

## 2015-07-29 NOTE — Patient Instructions (Signed)
Thank you for choosing [facility].  Summary/Instructions:  Your prescription(s) have been submitted to your pharmacy or been printed and provided for you. Please take as directed and contact our office if you believe you are having problem(s) with the medication(s) or have any questions.  Please stop by the lab on the basement level of the building for your blood work. Your results will be released to [place] (or called to you) after review, usually within 72 hours after test completion. If any changes need to be made, you will be notified at that same time.  If your symptoms worsen or fail to improve, please contact our office for further instruction, or in case of emergency go directly to the emergency room at the closest medical facility.   Health Maintenance, Female Adopting a healthy lifestyle and getting preventive care can go a long way to promote health and wellness. Talk with your health care provider about what schedule of regular examinations is right for you. This is a good chance for you to check in with your provider about disease prevention and staying healthy. In between checkups, there are plenty of things you can do on your own. Experts have done a lot of research about which lifestyle changes and preventive measures are most likely to keep you healthy. Ask your health care provider for more information. WEIGHT AND DIET  Eat a healthy diet  Be sure to include plenty of vegetables, fruits, low-fat dairy products, and lean protein.  Do not eat a lot of foods high in solid fats, added sugars, or salt.  Get regular exercise. This is one of the most important things you can do for your health.  Most adults should exercise for at least 150 minutes each week. The exercise should increase your heart rate and make you sweat (moderate-intensity exercise).  Most adults should also do strengthening exercises at least twice a week. This is in addition to the moderate-intensity  exercise.  Maintain a healthy weight  Body mass index (BMI) is a measurement that can be used to identify possible weight problems. It estimates body fat based on height and weight. Your health care provider can help determine your BMI and help you achieve or maintain a healthy weight.  For females 51 years of age and older:   A BMI below 18.5 is considered underweight.  A BMI of 18.5 to 24.9 is normal.  A BMI of 25 to 29.9 is considered overweight.  A BMI of 30 and above is considered obese.  Watch levels of cholesterol and blood lipids  You should start having your blood tested for lipids and cholesterol at 53 years of age, then have this test every 5 years.  You may need to have your cholesterol levels checked more often if:  Your lipid or cholesterol levels are high.  You are older than 53 years of age.  You are at high risk for heart disease.  CANCER SCREENING   Lung Cancer  Lung cancer screening is recommended for adults 54-55 years old who are at high risk for lung cancer because of a history of smoking.  A yearly low-dose CT scan of the lungs is recommended for people who:  Currently smoke.  Have quit within the past 15 years.  Have at least a 30-pack-year history of smoking. A pack year is smoking an average of one pack of cigarettes a day for 1 year.  Yearly screening should continue until it has been 15 years since you quit.  Yearly screening should stop if you develop a health problem that would prevent you from having lung cancer treatment.  Breast Cancer  Practice breast self-awareness. This means understanding how your breasts normally appear and feel.  It also means doing regular breast self-exams. Let your health care provider know about any changes, no matter how small.  If you are in your 20s or 30s, you should have a clinical breast exam (CBE) by a health care provider every 1-3 years as part of a regular health exam.  If you are 33 or  older, have a CBE every year. Also consider having a breast X-ray (mammogram) every year.  If you have a family history of breast cancer, talk to your health care provider about genetic screening.  If you are at high risk for breast cancer, talk to your health care provider about having an MRI and a mammogram every year.  Breast cancer gene (BRCA) assessment is recommended for women who have family members with BRCA-related cancers. BRCA-related cancers include:  Breast.  Ovarian.  Tubal.  Peritoneal cancers.  Results of the assessment will determine the need for genetic counseling and BRCA1 and BRCA2 testing. Cervical Cancer Your health care provider may recommend that you be screened regularly for cancer of the pelvic organs (ovaries, uterus, and vagina). This screening involves a pelvic examination, including checking for microscopic changes to the surface of your cervix (Pap test). You may be encouraged to have this screening done every 3 years, beginning at age 33.  For women ages 40-65, health care providers may recommend pelvic exams and Pap testing every 3 years, or they may recommend the Pap and pelvic exam, combined with testing for human papilloma virus (HPV), every 5 years. Some types of HPV increase your risk of cervical cancer. Testing for HPV may also be done on women of any age with unclear Pap test results.  Other health care providers may not recommend any screening for nonpregnant women who are considered low risk for pelvic cancer and who do not have symptoms. Ask your health care provider if a screening pelvic exam is right for you.  If you have had past treatment for cervical cancer or a condition that could lead to cancer, you need Pap tests and screening for cancer for at least 20 years after your treatment. If Pap tests have been discontinued, your risk factors (such as having a new sexual partner) need to be reassessed to determine if screening should resume. Some  women have medical problems that increase the chance of getting cervical cancer. In these cases, your health care provider may recommend more frequent screening and Pap tests. Colorectal Cancer  This type of cancer can be detected and often prevented.  Routine colorectal cancer screening usually begins at 53 years of age and continues through 53 years of age.  Your health care provider may recommend screening at an earlier age if you have risk factors for colon cancer.  Your health care provider may also recommend using home test kits to check for hidden blood in the stool.  A small camera at the end of a tube can be used to examine your colon directly (sigmoidoscopy or colonoscopy). This is done to check for the earliest forms of colorectal cancer.  Routine screening usually begins at age 15.  Direct examination of the colon should be repeated every 5-10 years through 53 years of age. However, you may need to be screened more often if early forms of precancerous polyps or  small growths are found. Skin Cancer  Check your skin from head to toe regularly.  Tell your health care provider about any new moles or changes in moles, especially if there is a change in a mole's shape or color.  Also tell your health care provider if you have a mole that is larger than the size of a pencil eraser.  Always use sunscreen. Apply sunscreen liberally and repeatedly throughout the day.  Protect yourself by wearing long sleeves, pants, a wide-brimmed hat, and sunglasses whenever you are outside. HEART DISEASE, DIABETES, AND HIGH BLOOD PRESSURE   High blood pressure causes heart disease and increases the risk of stroke. High blood pressure is more likely to develop in:  People who have blood pressure in the high end of the normal range (130-139/85-89 mm Hg).  People who are overweight or obese.  People who are African American.  If you are 18-39 years of age, have your blood pressure checked every  3-5 years. If you are 40 years of age or older, have your blood pressure checked every year. You should have your blood pressure measured twice--once when you are at a hospital or clinic, and once when you are not at a hospital or clinic. Record the average of the two measurements. To check your blood pressure when you are not at a hospital or clinic, you can use:  An automated blood pressure machine at a pharmacy.  A home blood pressure monitor.  If you are between 55 years and 79 years old, ask your health care provider if you should take aspirin to prevent strokes.  Have regular diabetes screenings. This involves taking a blood sample to check your fasting blood sugar level.  If you are at a normal weight and have a low risk for diabetes, have this test once every three years after 53 years of age.  If you are overweight and have a high risk for diabetes, consider being tested at a younger age or more often. PREVENTING INFECTION  Hepatitis B  If you have a higher risk for hepatitis B, you should be screened for this virus. You are considered at high risk for hepatitis B if:  You were born in a country where hepatitis B is common. Ask your health care provider which countries are considered high risk.  Your parents were born in a high-risk country, and you have not been immunized against hepatitis B (hepatitis B vaccine).  You have HIV or AIDS.  You use needles to inject street drugs.  You live with someone who has hepatitis B.  You have had sex with someone who has hepatitis B.  You get hemodialysis treatment.  You take certain medicines for conditions, including cancer, organ transplantation, and autoimmune conditions. Hepatitis C  Blood testing is recommended for:  Everyone born from 1945 through 1965.  Anyone with known risk factors for hepatitis C. Sexually transmitted infections (STIs)  You should be screened for sexually transmitted infections (STIs) including  gonorrhea and chlamydia if:  You are sexually active and are younger than 53 years of age.  You are older than 53 years of age and your health care provider tells you that you are at risk for this type of infection.  Your sexual activity has changed since you were last screened and you are at an increased risk for chlamydia or gonorrhea. Ask your health care provider if you are at risk.  If you do not have HIV, but are at risk, it may be   recommended that you take a prescription medicine daily to prevent HIV infection. This is called pre-exposure prophylaxis (PrEP). You are considered at risk if:  You are sexually active and do not regularly use condoms or know the HIV status of your partner(s).  You take drugs by injection.  You are sexually active with a partner who has HIV. Talk with your health care provider about whether you are at high risk of being infected with HIV. If you choose to begin PrEP, you should first be tested for HIV. You should then be tested every 3 months for as long as you are taking PrEP.  PREGNANCY   If you are premenopausal and you may become pregnant, ask your health care provider about preconception counseling.  If you may become pregnant, take 400 to 800 micrograms (mcg) of folic acid every day.  If you want to prevent pregnancy, talk to your health care provider about birth control (contraception). OSTEOPOROSIS AND MENOPAUSE   Osteoporosis is a disease in which the bones lose minerals and strength with aging. This can result in serious bone fractures. Your risk for osteoporosis can be identified using a bone density scan.  If you are 51 years of age or older, or if you are at risk for osteoporosis and fractures, ask your health care provider if you should be screened.  Ask your health care provider whether you should take a calcium or vitamin D supplement to lower your risk for osteoporosis.  Menopause may have certain physical symptoms and  risks.  Hormone replacement therapy may reduce some of these symptoms and risks. Talk to your health care provider about whether hormone replacement therapy is right for you.  HOME CARE INSTRUCTIONS   Schedule regular health, dental, and eye exams.  Stay current with your immunizations.   Do not use any tobacco products including cigarettes, chewing tobacco, or electronic cigarettes.  If you are pregnant, do not drink alcohol.  If you are breastfeeding, limit how much and how often you drink alcohol.  Limit alcohol intake to no more than 1 drink per day for nonpregnant women. One drink equals 12 ounces of beer, 5 ounces of wine, or 1 ounces of hard liquor.  Do not use street drugs.  Do not share needles.  Ask your health care provider for help if you need support or information about quitting drugs.  Tell your health care provider if you often feel depressed.  Tell your health care provider if you have ever been abused or do not feel safe at home.   This information is not intended to replace advice given to you by your health care provider. Make sure you discuss any questions you have with your health care provider.   Document Released: 04/16/2011 Document Revised: 10/22/2014 Document Reviewed: 09/02/2013 Elsevier Interactive Patient Education 2016 [employer].  Smoking Cessation, Tips for Success If you are ready to quit smoking, congratulations! You have chosen to help yourself be healthier. Cigarettes bring nicotine, tar, carbon monoxide, and other irritants into your body. Your lungs, heart, and blood vessels will be able to work better without these poisons. There are many different ways to quit smoking. Nicotine gum, nicotine patches, a nicotine inhaler, or nicotine nasal spray can help with physical craving. Hypnosis, support groups, and medicines help break the habit of smoking. WHAT THINGS CAN I DO TO MAKE QUITTING EASIER?  Here are some tips to help you quit for  good:  Pick a date when you will quit smoking completely.  Tell all of your friends and family about your plan to quit on that date.  Do not try to slowly cut down on the number of cigarettes you are smoking. Pick a quit date and quit smoking completely starting on that day.  Throw away all cigarettes.   Clean and remove all ashtrays from your home, work, and car.  On a card, write down your reasons for quitting. Carry the card with you and read it when you get the urge to smoke.  Cleanse your body of nicotine. Drink enough water and fluids to keep your urine clear or pale yellow. Do this after quitting to flush the nicotine from your body.  Learn to predict your moods. Do not let a bad situation be your excuse to have a cigarette. Some situations in your life might tempt you into wanting a cigarette.  Never have "just one" cigarette. It leads to wanting another and another. Remind yourself of your decision to quit.  Change habits associated with smoking. If you smoked while driving or when feeling stressed, try other activities to replace smoking. Stand up when drinking your coffee. Brush your teeth after eating. Sit in a different chair when you read the paper. Avoid alcohol while trying to quit, and try to drink fewer caffeinated beverages. Alcohol and caffeine may urge you to smoke.  Avoid foods and drinks that can trigger a desire to smoke, such as sugary or spicy foods and alcohol.  Ask people who smoke not to smoke around you.  Have something planned to do right after eating or having a cup of coffee. For example, plan to take a walk or exercise.  Try a relaxation exercise to calm you down and decrease your stress. Remember, you may be tense and nervous for the first 2 weeks after you quit, but this will pass.  Find new activities to keep your hands busy. Play with a pen, coin, or rubber band. Doodle or draw things on paper.  Brush your teeth right after eating. This will help  cut down on the craving for the taste of tobacco after meals. You can also try mouthwash.   Use oral substitutes in place of cigarettes. Try using lemon drops, carrots, cinnamon sticks, or chewing gum. Keep them handy so they are available when you have the urge to smoke.  When you have the urge to smoke, try deep breathing.  Designate your home as a nonsmoking area.  If you are a heavy smoker, ask your health care provider about a prescription for nicotine chewing gum. It can ease your withdrawal from nicotine.  Reward yourself. Set aside the cigarette money you save and buy yourself something nice.  Look for support from others. Join a support group or smoking cessation program. Ask someone at home or at work to help you with your plan to quit smoking.  Always ask yourself, "Do I need this cigarette or is this just a reflex?" Tell yourself, "Today, I choose not to smoke," or "I do not want to smoke." You are reminding yourself of your decision to quit.  Do not replace cigarette smoking with electronic cigarettes (commonly called e-cigarettes). The safety of e-cigarettes is unknown, and some may contain harmful chemicals.  If you relapse, do not give up! Plan ahead and think about what you will do the next time you get the urge to smoke. HOW WILL I FEEL WHEN I QUIT SMOKING? You may have symptoms of withdrawal because your body is used to nicotine (  the addictive substance in cigarettes). You may crave cigarettes, be irritable, feel very hungry, cough often, get headaches, or have difficulty concentrating. The withdrawal symptoms are only temporary. They are strongest when you first quit but will go away within 10-14 days. When withdrawal symptoms occur, stay in control. Think about your reasons for quitting. Remind yourself that these are signs that your body is healing and getting used to being without cigarettes. Remember that withdrawal symptoms are easier to treat than the major diseases  that smoking can cause.  Even after the withdrawal is over, expect periodic urges to smoke. However, these cravings are generally short lived and will go away whether you smoke or not. Do not smoke! WHAT RESOURCES ARE AVAILABLE TO HELP ME QUIT SMOKING? Your health care provider can direct you to community resources or hospitals for support, which may include:  Group support.  Education.  Hypnosis.  Therapy.   This information is not intended to replace advice given to you by your health care provider. Make sure you discuss any questions you have with your health care provider.   Document Released: 06/29/2004 Document Revised: 10/22/2014 Document Reviewed: 03/19/2013 Elsevier Interactive Patient Education 2016 [employer].  Steps to Quit Smoking  Smoking tobacco can be harmful to your health and can affect almost every organ in your body. Smoking puts you, and those around you, at risk for developing many serious chronic diseases. Quitting smoking is difficult, but it is one of the best things that you can do for your health. It is never too late to quit. WHAT ARE THE BENEFITS OF QUITTING SMOKING? When you quit smoking, you lower your risk of developing serious diseases and conditions, such as:  Lung cancer or lung disease, such as COPD.  Heart disease.  Stroke.  Heart attack.  Infertility.  Osteoporosis and bone fractures. Additionally, symptoms such as coughing, wheezing, and shortness of breath may get better when you quit. You may also find that you get sick less often because your body is stronger at fighting off colds and infections. If you are pregnant, quitting smoking can help to reduce your chances of having a baby of low birth weight. HOW DO I GET READY TO QUIT? When you decide to quit smoking, create a plan to make sure that you are successful. Before you quit:  Pick a date to quit. Set a date within the next two weeks to give you time to prepare.  Write down the  reasons why you are quitting. Keep this list in places where you will see it often, such as on your bathroom mirror or in your car or wallet.  Identify the people, places, things, and activities that make you want to smoke (triggers) and avoid them. Make sure to take these actions:  Throw away all cigarettes at home, at work, and in your car.  Throw away smoking accessories, such as Scientist, research (medical).  Clean your car and make sure to empty the ashtray.  Clean your home, including curtains and carpets.  Tell your family, friends, and coworkers that you are quitting. Support from your loved ones can make quitting easier.  Talk with your health care provider about your options for quitting smoking.  Find out what treatment options are covered by your health insurance. WHAT STRATEGIES CAN I USE TO QUIT SMOKING?  Talk with your healthcare provider about different strategies to quit smoking. Some strategies include:  Quitting smoking altogether instead of gradually lessening how much you smoke over a  period of time. Research shows that quitting "cold [place]" is more successful than gradually quitting.  Attending in-person counseling to help you build problem-solving skills. You are more likely to have success in quitting if you attend several counseling sessions. Even short sessions of 10 minutes can be effective.  Finding resources and support systems that can help you to quit smoking and remain smoke-free after you quit. These resources are most helpful when you use them often. They can include:  Online chats with a Social worker.  Telephone quitlines.  Printed Furniture conservator/restorer.  Support groups or group counseling.  Text messaging programs.  Mobile phone applications.  Taking medicines to help you quit smoking. (If you are pregnant or breastfeeding, talk with your health care provider first.) Some medicines contain nicotine and some do not. Both types of medicines help with  cravings, but the medicines that include nicotine help to relieve withdrawal symptoms. Your health care provider may recommend:  Nicotine patches, gum, or lozenges.  Nicotine inhalers or sprays.  Non-nicotine medicine that is taken by mouth. Talk with your health care provider about combining strategies, such as taking medicines while you are also receiving in-person counseling. Using these two strategies together makes you more likely to succeed in quitting than if you used either strategy on its own. If you are pregnant or breastfeeding, talk with your health care provider about finding counseling or other support strategies to quit smoking. Do not take medicine to help you quit smoking unless told to do so by your health care provider. WHAT THINGS CAN I DO TO MAKE IT EASIER TO QUIT? Quitting smoking might feel overwhelming at first, but there is a lot that you can do to make it easier. Take these important actions:  Reach out to your family and friends and ask that they support and encourage you during this time. Call telephone quitlines, reach out to support groups, or work with a counselor for support.  Ask people who smoke to avoid smoking around you.  Avoid places that trigger you to smoke, such as bars, parties, or smoke-break areas at work.  Spend time around people who do not smoke.  Lessen stress in your life, because stress can be a smoking trigger for some people. To lessen stress, try:  Exercising regularly.  Deep-breathing exercises.  Yoga.  Meditating.  Performing a body scan. This involves closing your eyes, scanning your body from head to toe, and noticing which parts of your body are particularly tense. Purposefully relax the muscles in those areas.  Download or purchase mobile phone or tablet apps (applications) that can help you stick to your quit plan by providing reminders, tips, and encouragement. There are many free apps, such as QuitGuide from the [employer]  Office manager for Disease Control and Prevention). You can find other support for quitting smoking (smoking cessation) through smokefree.gov and other websites. HOW WILL I FEEL WHEN I QUIT SMOKING? Within the first 24 hours of quitting smoking, you may start to feel some withdrawal symptoms. These symptoms are usually most noticeable 2-3 days after quitting, but they usually do not last beyond 2-3 weeks. Changes or symptoms that you might experience include:  Mood swings.  Restlessness, anxiety, or irritation.  Difficulty concentrating.  Dizziness.  Strong cravings for sugary foods in addition to nicotine.  Mild weight gain.  Constipation.  Nausea.  Coughing or a sore throat.  Changes in how your medicines work in your body.  A depressed mood.  Difficulty sleeping (insomnia). After the first  2-3 weeks of quitting, you may start to notice more positive results, such as:  Improved sense of smell and taste.  Decreased coughing and sore throat.  Slower heart rate.  Lower blood pressure.  Clearer skin.  The ability to breathe more easily.  Fewer sick days. Quitting smoking is very challenging for most people. Do not get discouraged if you are not successful the first time. Some people need to make many attempts to quit before they achieve long-term success. Do your best to stick to your quit plan, and talk with your health care provider if you have any questions or concerns.   This information is not intended to replace advice given to you by your health care provider. Make sure you discuss any questions you have with your health care provider.   Document Released: 09/25/2001 Document Revised: 02/15/2015 Document Reviewed: 02/15/2015 Elsevier Interactive Patient Education [employer].

## 2015-07-29 NOTE — Progress Notes (Signed)
Subjective:    Patient ID: Jody [name], female    DOB: [birth year], 54 y.o.   MRN: [id number]  Chief Complaint  Patient presents with  . CPE    Fasting, refill of the valium and the cream    HPI:  Jody [name] is a 54 y.o. female who presents today for an annual wellness visit.   1) Health Maintenance -   Diet - Averages about 2 meals per day consisting of fruits, vegetables, salad, chicken, sweets  Exercise - Working out at [employer] a couple of times per week with a mixture of cardio and resistance training   2) Preventative Exams / Immunizations:  Dental -- Up to date  Vision -- Up to date   Health Maintenance  Topic Date Due  . HIV Screening  07/10/1977  . INFLUENZA VACCINE  05/16/2015  . MAMMOGRAM  03/22/2016  . PAP SMEAR  06/15/2017  . COLONOSCOPY  04/24/2018  . TETANUS/TDAP  05/15/2020  . Hepatitis C Screening  Completed    Immunization History  Administered Date(s) Administered  . Influenza,inj,Quad PF,36+ Mos 07/29/2015  . Influenza-Unspecified 07/15/2012, 07/30/2014  . Pneumococcal Polysaccharide-23 05/10/2013  Flu shot    Review of Systems  Constitutional: Denies fever, chills, fatigue, or significant weight gain/loss. HENT: Head: Denies headache or neck pain Ears: Denies changes in hearing, ringing in ears, earache, drainage Nose: Denies discharge, stuffiness, itching, nosebleed, sinus pain Throat: Denies sore throat, hoarseness, dry mouth, sores, thrush Eyes: Denies loss/changes in vision, pain, redness, blurry/double vision, flashing lights Cardiovascular: Denies chest pain/discomfort, tightness, palpitations, shortness of breath with activity, difficulty lying down, swelling, sudden awakening with shortness of breath Respiratory: Denies shortness of breath, cough, sputum production, wheezing Gastrointestinal: Denies dysphasia, heartburn, change in appetite, nausea, change in bowel habits, rectal bleeding, constipation, diarrhea,  yellow skin or eyes Genitourinary: Denies frequency, urgency, burning/pain, blood in urine, incontinence, change in urinary strength. Musculoskeletal: Denies muscle/joint pain, stiffness, back pain, redness or swelling of joints, trauma Skin: Denies rashes, lumps, itching, dryness, color changes, or hair/nail changes Neurological: Denies dizziness, fainting, seizures, weakness, numbness, tingling, tremor Psychiatric - Denies nervousness, stress, depression or memory loss Endocrine: Denies heat or cold intolerance, sweating, frequent urination, excessive thirst, changes in appetite Hematologic: Denies ease of bruising or bleeding     Objective:     BP 138/86 mmHg  Pulse 56  Temp(Src) 98 F (36.7 C) (Oral)  Resp 18  Ht 5' 5.5" (1.664 m)  Wt 250 lb 12.8 oz (113.762 kg)  BMI 41.09 kg/m2  SpO2 98% Nursing note and vital signs reviewed.  Physical Exam  Constitutional: She is oriented to person, place, and time. She appears well-developed and well-nourished.  HENT:  Head: Normocephalic.  Right Ear: Hearing, tympanic membrane, external ear and ear canal normal.  Left Ear: Hearing, tympanic membrane, external ear and ear canal normal.  Nose: Nose normal.  Mouth/Throat: Uvula is midline, oropharynx is clear and moist and mucous membranes are normal.  Eyes: Conjunctivae and EOM are normal. Pupils are equal, round, and reactive to light.  Neck: Neck supple. No JVD present. No tracheal deviation present. No thyromegaly present.  Cardiovascular: Normal rate, regular rhythm, normal heart sounds and intact distal pulses.   Pulmonary/Chest: Effort normal and breath sounds normal.  Abdominal: Soft. Bowel sounds are normal. She exhibits no distension and no mass. There is no tenderness. There is no rebound and no guarding.  Musculoskeletal: Normal range of motion. She exhibits no edema or tenderness.  Lymphadenopathy:  She has no cervical adenopathy.  Neurological: She is alert and oriented to  person, place, and time. She has normal reflexes. No cranial nerve deficit. She exhibits normal muscle tone. Coordination normal.  Skin: Skin is warm and dry.  Psychiatric: She has a normal mood and affect. Her behavior is normal. Judgment and thought content normal.       Assessment & Plan:   Problem List Items Addressed This Visit      Digestive   GERD (gastroesophageal reflux disease)   Relevant Medications   pantoprazole (PROTONIX) 40 MG tablet   Eluxadoline (VIBERZI) 100 MG TABS   Irritable bowel syndrome with diarrhea   Relevant Medications   pantoprazole (PROTONIX) 40 MG tablet   Eluxadoline (VIBERZI) 100 MG TABS     Other   Morbid obesity ([place])   Routine general medical examination at a health care facility - Primary    1) Anticipatory Guidance: Discussed importance of wearing a seatbelt while driving and not texting while driving; changing batteries in smoke detector at least once annually; wearing suntan lotion when outside; eating a balanced and moderate diet; getting physical activity at least 30 minutes per day.  2) Immunizations / Screenings / Labs:  Flu shot updated today. All other immunizations are up to date per recommendations. Declines HIV. All other screenings are up to date per recommendations. Obtain CBC, CMET, Lipid profile and TSH.   Overall well exam with risk factors for cardiovascular and chronic disease include obesity, hypertension and hyperlipidemia. Hypertension is currently adequately controlled with medication. Obtain lipid profile to determine current lipid status. Obesity with goals of improving nutrient density of foods and decreasing saturated fat intake. Also recommend 30 minutes of physical activity daily. Establish goal weight loss of 5-10% of current body weight. Follow up prevention exam in 1 year. Follow up office visit pending lab work.        Relevant Orders   Comprehensive metabolic panel   CBC   Lipid panel   TSH   Hyperlipidemia     Relevant Medications   triamterene-hydrochlorothiazide (DYAZIDE) 37.5-25 MG capsule   simvastatin (ZOCOR) 40 MG tablet    Other Visit Diagnoses    Essential hypertension        Relevant Medications    triamterene-hydrochlorothiazide (DYAZIDE) 37.5-25 MG capsule    simvastatin (ZOCOR) 40 MG tablet    Encounter for immunization

## 2015-11-23 ENCOUNTER — Other Ambulatory Visit: Payer: Self-pay | Admitting: Orthopaedic Surgery

## 2015-11-23 DIAGNOSIS — M5136 Other intervertebral disc degeneration, lumbar region: Secondary | ICD-10-CM

## 2015-12-07 ENCOUNTER — Ambulatory Visit
Admission: RE | Admit: 2015-12-07 | Discharge: 2015-12-07 | Disposition: A | Discharge status: Home / Self Care | Payer: No Typology Code available for payment source | Source: Ambulatory Visit | Attending: Orthopaedic Surgery | Admitting: Orthopaedic Surgery

## 2015-12-07 DIAGNOSIS — M5136 Other intervertebral disc degeneration, lumbar region: Secondary | ICD-10-CM

## 2017-07-06 IMAGING — MR MR LUMBAR SPINE W/O CM
4 of 5 series · 21 of 48 positions shown · non-contrast
Comparison: Lumbar MRI 09/12/2008

CLINICAL DATA: Lumbar disc degeneration. Low back pain with right
posterior leg pain

EXAM:
MRI LUMBAR SPINE WITHOUT CONTRAST
TECHNIQUE: Multiplanar, multisequence MR imaging of the lumbar spine was
performed. No intravenous contrast was administered.

[Series 7: T1 · sagittal · 4.0mm · 0.73mm/px · 3 of 13 slices shown (1 of 2)]
[im 3/13]
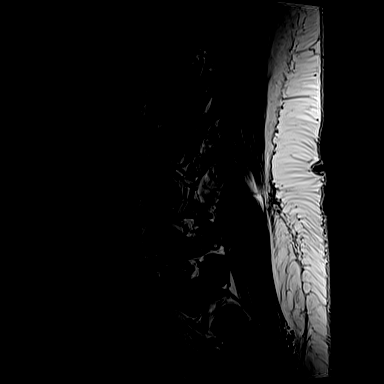
[im 8/13]
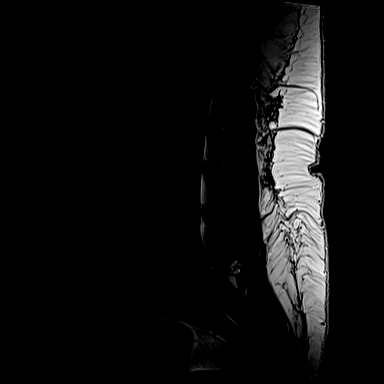
[im 13/13]
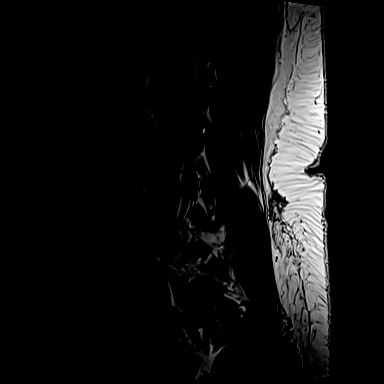

[Series 8: T2 · sagittal · 4.0mm · 0.73mm/px · 6 of 13 slices shown (1 of 2)]
[im 1/13]
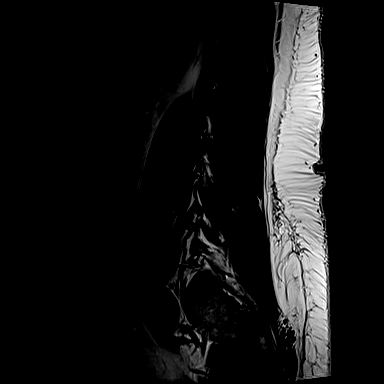
[im 3/13]
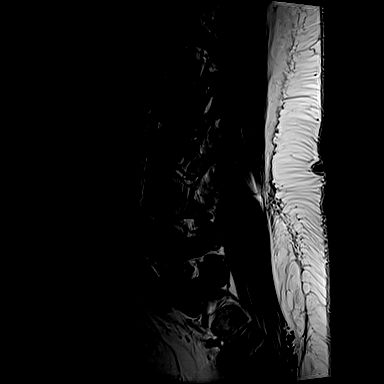
[im 5/13]
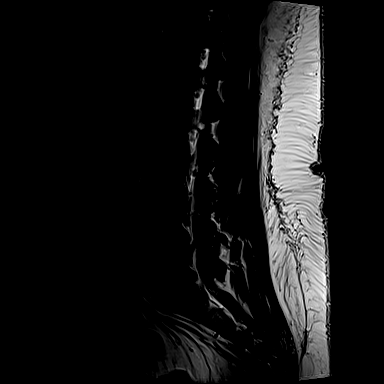
[im 8/13]
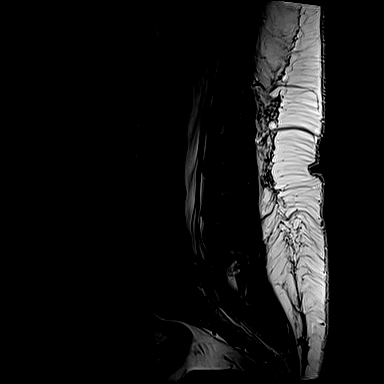
[im 10/13]
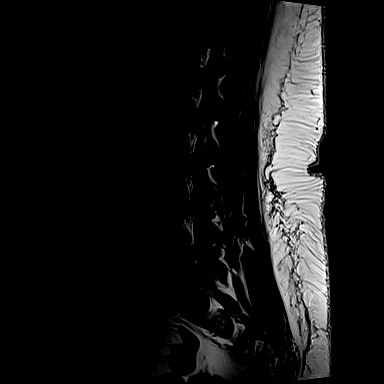
[im 13/13]
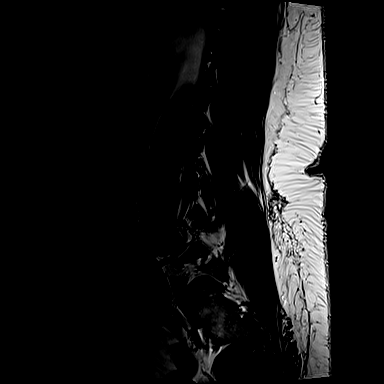

[Series 9: T1 · axial · 4.0mm · 0.56mm/px · z∈[-19,+109]mm · 3 of 31 slices shown (2 of 2)]
[im 5/31]
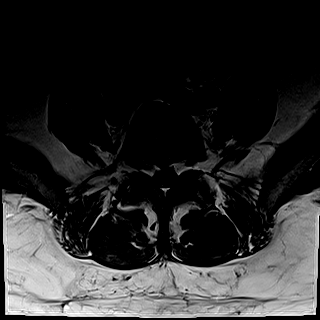
[im 16/31]
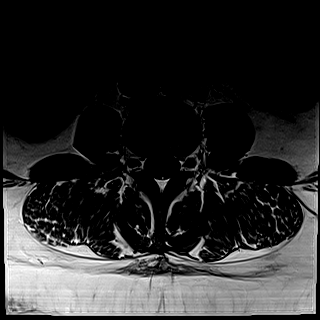
[im 26/31]
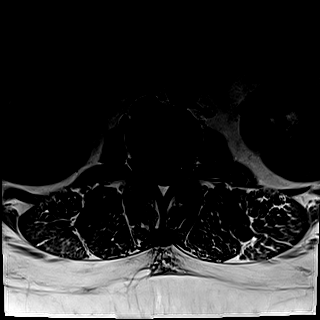

[Series 10: T2 · axial · 4.0mm · 0.28mm/px · z∈[-39,+134]mm · 9 of 31 slices shown (2 of 2)]
[im 1/31]
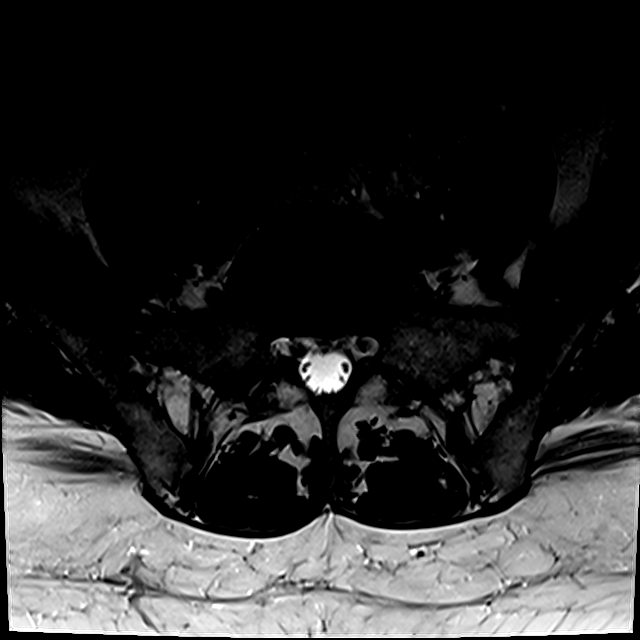
[im 5/31]
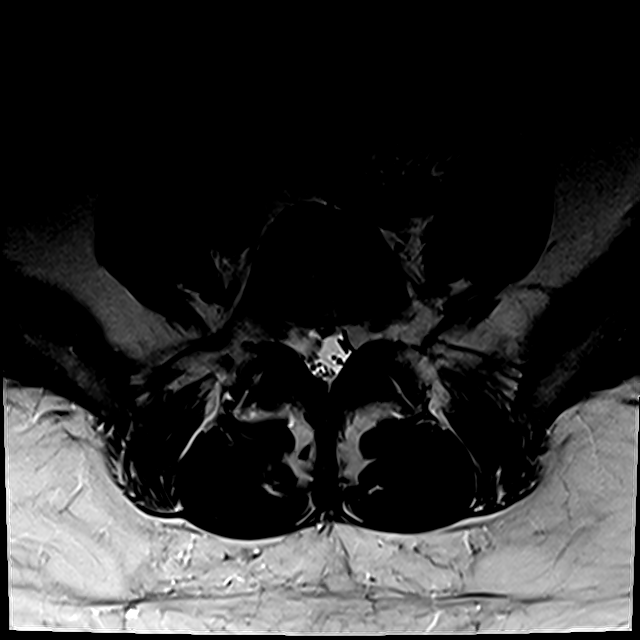
[im 9/31]
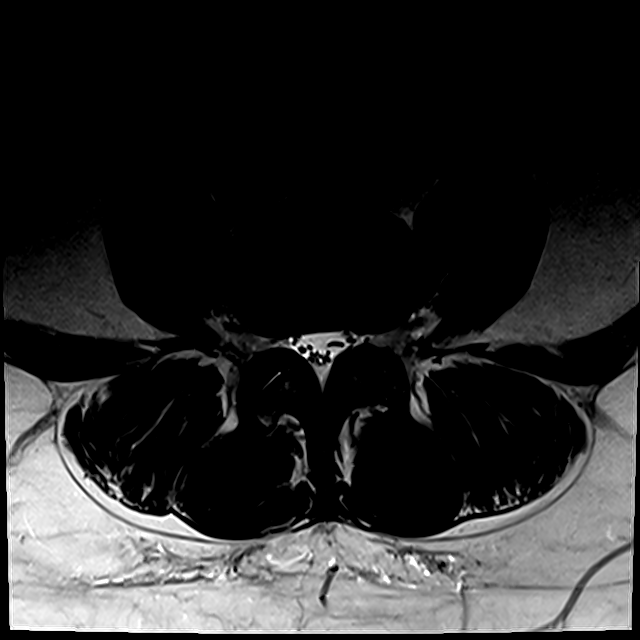
[im 13/31]
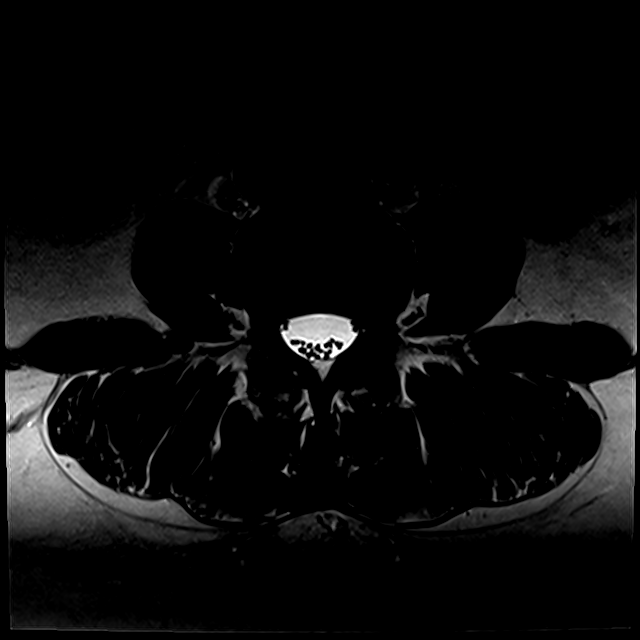
[im 16/31]
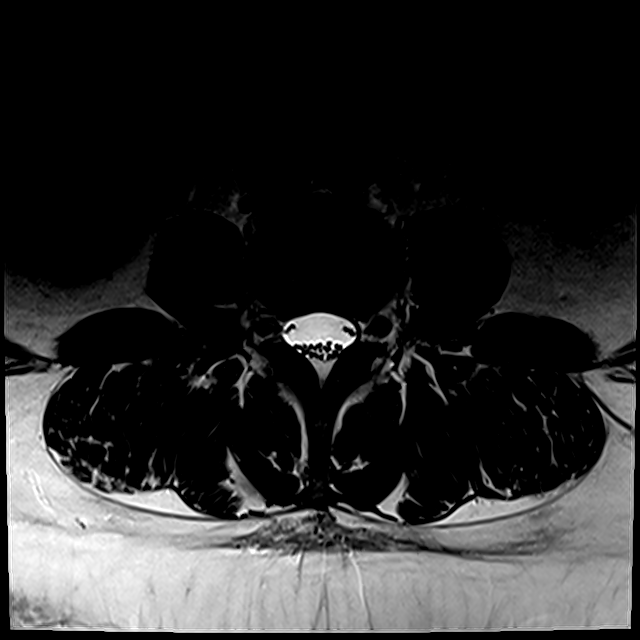
[im 18/31]
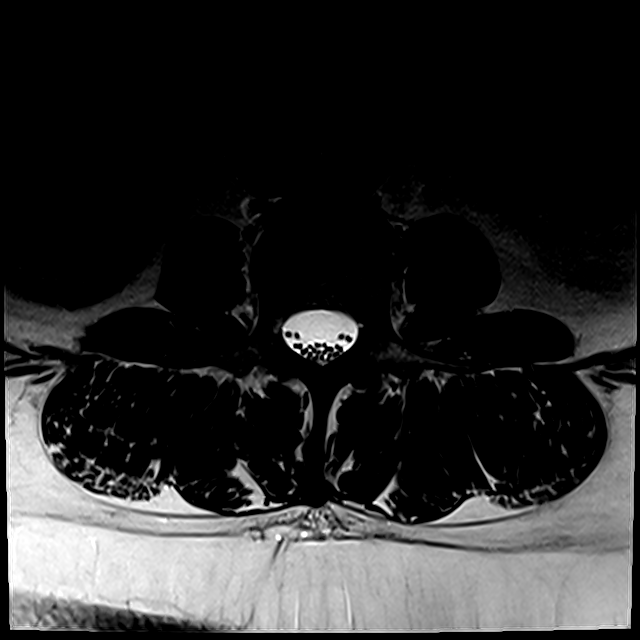
[im 22/31]
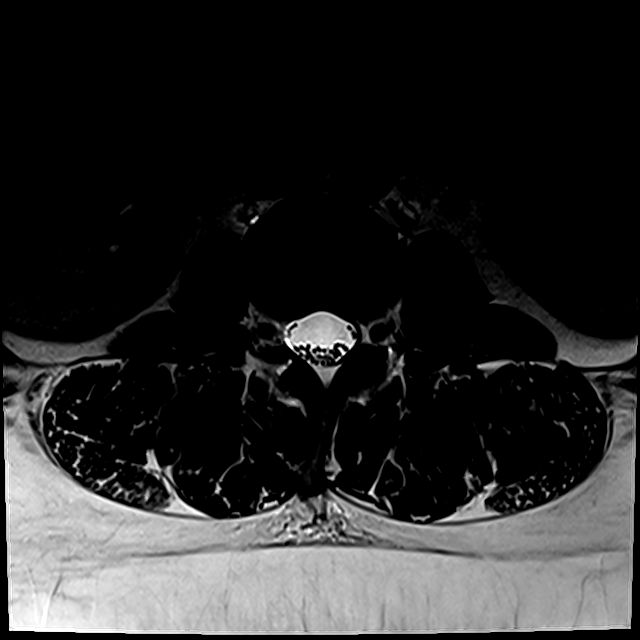
[im 26/31]
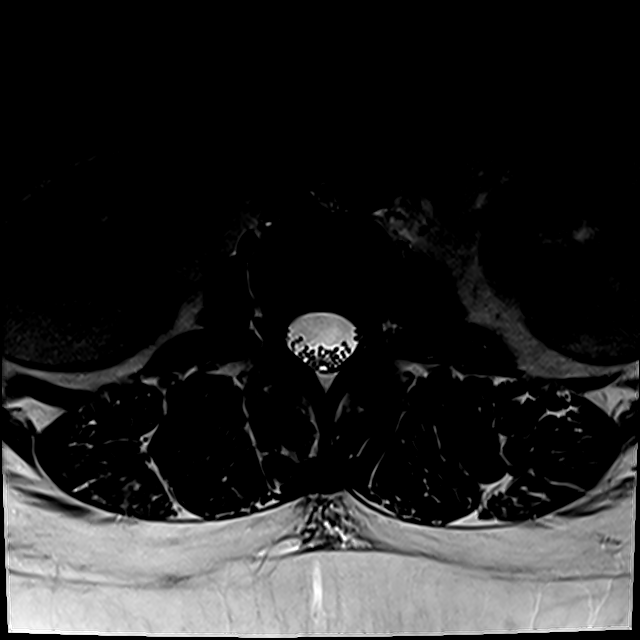
[im 31/31]
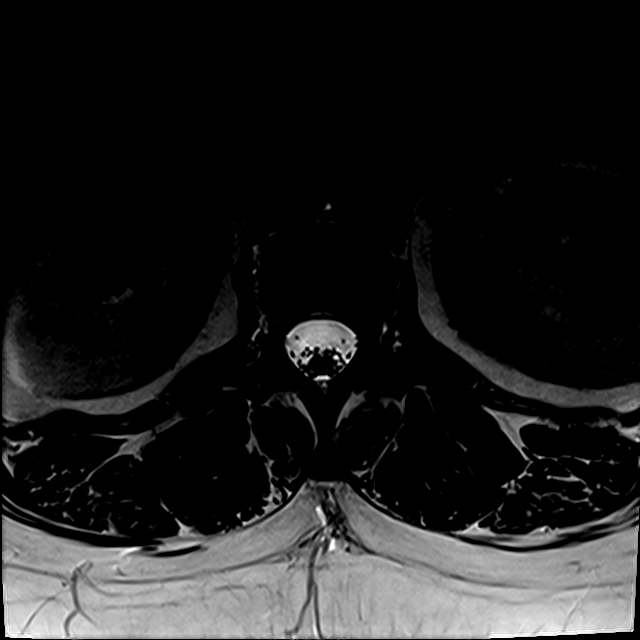

[21 of 48 positions shown; findings below may reference images not displayed]

FINDINGS: Normal lumbar alignment. Negative for fracture or mass lesion. Conus
medullaris appears normal and terminates at L1-2

L1-2:  Negative

L2-3:  Negative

L3-4:  Negative

L4-5: Diffuse bulging of the disc. Bilateral facet hypertrophy
causing mild spinal stenosis. Mild progression of spinal stenosis
since the prior study

L5-S1: Extruded disc fragment on the right has progressed in the
interval. Epidural disc fragment is compressing the right S1 nerve
root as well as the thecal sac. Mild facet degeneration bilaterally.
IMPRESSION: Mild spinal stenosis L4-5 has progressed since 3008

Extruded disc fragment on the right L5-S1 with compression of the
right S1 nerve root and thecal sac. Small disc protrusion was seen
in this area in 3008.

## 2018-04-03 ENCOUNTER — Encounter: Payer: Self-pay | Admitting: Gastroenterology
